# Patient Record
Sex: Male | Born: 1975 | Race: Black or African American | Hispanic: No | Marital: Married | Smoking: Current some day smoker
Health system: Southern US, Community
[De-identification: ages and names within clinical notes are randomized; demographics above are authoritative.]

## PROBLEM LIST (undated history)

## (undated) DIAGNOSIS — I10 Essential (primary) hypertension: Secondary | ICD-10-CM

## (undated) DIAGNOSIS — E669 Obesity, unspecified: Secondary | ICD-10-CM

## (undated) DIAGNOSIS — G4730 Sleep apnea, unspecified: Secondary | ICD-10-CM

## (undated) HISTORY — PX: HERNIA REPAIR: SHX51

---

## 2011-05-12 ENCOUNTER — Emergency Department (HOSPITAL_COMMUNITY): Payer: Medicaid Other

## 2011-05-12 ENCOUNTER — Encounter (HOSPITAL_COMMUNITY): Payer: Self-pay | Admitting: Emergency Medicine

## 2011-05-12 ENCOUNTER — Emergency Department (HOSPITAL_COMMUNITY)
Admission: EM | Admit: 2011-05-12 | Discharge: 2011-05-12 | Disposition: A | Discharge status: Home / Self Care | Payer: Medicaid Other | Attending: Emergency Medicine | Admitting: Emergency Medicine

## 2011-05-12 ENCOUNTER — Other Ambulatory Visit: Payer: Self-pay

## 2011-05-12 DIAGNOSIS — R0602 Shortness of breath: Secondary | ICD-10-CM | POA: Insufficient documentation

## 2011-05-12 DIAGNOSIS — F17200 Nicotine dependence, unspecified, uncomplicated: Secondary | ICD-10-CM | POA: Insufficient documentation

## 2011-05-12 DIAGNOSIS — R51 Headache: Secondary | ICD-10-CM

## 2011-05-12 DIAGNOSIS — Z79899 Other long term (current) drug therapy: Secondary | ICD-10-CM | POA: Insufficient documentation

## 2011-05-12 DIAGNOSIS — R079 Chest pain, unspecified: Secondary | ICD-10-CM | POA: Insufficient documentation

## 2011-05-12 DIAGNOSIS — I10 Essential (primary) hypertension: Secondary | ICD-10-CM

## 2011-05-12 DIAGNOSIS — H538 Other visual disturbances: Secondary | ICD-10-CM | POA: Insufficient documentation

## 2011-05-12 DIAGNOSIS — E669 Obesity, unspecified: Secondary | ICD-10-CM

## 2011-05-12 DIAGNOSIS — Z720 Tobacco use: Secondary | ICD-10-CM

## 2011-05-12 HISTORY — DX: Essential (primary) hypertension: I10

## 2011-05-12 LAB — BASIC METABOLIC PANEL
BUN: 13 mg/dL (ref 6–23)
Chloride: 103 mEq/L (ref 96–112)
Creatinine, Ser: 1.16 mg/dL (ref 0.50–1.35)
GFR calc Af Amer: >90 mL/min (ref >90)
GFR calc non Af Amer: 79 mL/min — ABNORMAL LOW (ref >90)

## 2011-05-12 LAB — CBC
HCT: 39 % (ref 39.0–52.0)
MCHC: 35.6 g/dL (ref 30.0–36.0)
MCV: 88.8 fL (ref 78.0–100.0)
RDW: 13 % (ref 11.5–15.5)

## 2011-05-12 LAB — GLUCOSE, CAPILLARY

## 2011-05-12 MED ORDER — ONDANSETRON HCL 4 MG/2ML IJ SOLN
4.0000 mg | Freq: Once | INTRAMUSCULAR | Status: AC
  Administered 2011-05-12: 4 mg via INTRAVENOUS
  Filled 2011-05-12: qty 2

## 2011-05-12 MED ORDER — LISINOPRIL-HYDROCHLOROTHIAZIDE 20-12.5 MG PO TABS: 1.0000 | ORAL_TABLET | Freq: Every day | ORAL | Status: DC

## 2011-05-12 MED ORDER — SODIUM CHLORIDE 0.9 % IV BOLUS (SEPSIS)
1000.0000 mL | Freq: Once | INTRAVENOUS | Status: AC
  Administered 2011-05-12: 1000 mL via INTRAVENOUS

## 2011-05-12 MED ORDER — HYDROMORPHONE HCL PF 1 MG/ML IJ SOLN
1.0000 mg | Freq: Once | INTRAMUSCULAR | Status: AC
  Administered 2011-05-12: 1 mg via INTRAVENOUS
  Filled 2011-05-12: qty 1

## 2011-05-12 NOTE — ED Provider Notes (Signed)
History     CSN: [id number]  Arrival date & time 05/12/11  1801   First MD Initiated Contact with Patient 05/12/11 2035      Chief Complaint  Patient presents with  . Chest Pain  . Headache    (Consider location/radiation/quality/duration/timing/severity/associated sxs/prior treatment) HPI  Patient states he has a history of hypertension and tobacco abuse but he has not taken his lisinopril-HCTZ in over a week because he has run out of his medication and recently moved to [facility] from [facility] presents to the ER complaining of a one-week history of waxing and waning left-sided chest pain stating that he will get acute onset chest pain that will last seconds to sometimes minutes. Patient states occasionally that chest pain is associated with mild shortness of breath and headache. Patient states that over the last week he's also been having waxing and waning headaches that are sometimes associated with chest pain and sometimes occur on their own. Patient states headache will come on acutely and last from minutes to hours and then resolved. Patient states he has a one-week history of intermittent blurry vision. Patient states he's currently having a headache and mild blurred vision but denies any chest pain. Patient states he last had chest pain at approximately 10 AM this morning. Patient denies family history of early heart disease or heart attack. Patient denies aggravating or alleviating factors. Patient is taking no medication prior to arrival to leave the symptoms. Patient notes that his blood pressure has been running higher than usual over the last week. He denies fevers, chills, dizziness, neck stiffness, abdominal pain, n/v/d, diaphoresis, dysuria, hematuria or blood in stool.  Past Medical History  Diagnosis Date  . Hypertension     History reviewed. No pertinent past surgical history.  History reviewed. No pertinent family history.  History  Substance Use Topics    . Smoking status: Current Everyday Smoker  . Smokeless tobacco: Not on file  . Alcohol Use: No      Review of Systems  All other systems reviewed and are negative.    Allergies  Review of patient's allergies indicates no known allergies.  Home Medications   Current Outpatient Rx  Name Route Sig Dispense Refill  . LISINOPRIL-HYDROCHLOROTHIAZIDE 10-12.5 MG PO TABS Oral Take 1 tablet by mouth daily.    . ADULT MULTIVITAMIN W/MINERALS CH Oral Take 2 tablets by mouth daily.      BP 143/79  Pulse 84  Temp(Src) 98 F (36.7 C) (Oral)  Resp 17  Ht 5\' 10"  (1.778 m)  Wt 280 lb (127.007 kg)  BMI 40.18 kg/m2  SpO2 95%  Physical Exam  Nursing note and vitals reviewed. Constitutional: He is oriented to person, place, and time. He appears well-developed and well-nourished. No distress.       obese  HENT:  Head: Normocephalic and atraumatic.  Eyes: Conjunctivae and EOM are normal. Pupils are equal, round, and reactive to light.  Neck: Normal range of motion. Neck supple.  Cardiovascular: Normal rate, regular rhythm, normal heart sounds and intact distal pulses.  Exam reveals no gallop and no friction rub.   No murmur heard. Pulmonary/Chest: Effort normal and breath sounds normal. No respiratory distress. He has no wheezes. He has no rales. He exhibits no tenderness.  Abdominal: Soft. Bowel sounds are normal. He exhibits no distension and no mass. There is no tenderness. There is no rebound and no guarding.  Musculoskeletal: Normal range of motion. He exhibits no edema and no tenderness.  Neurological: He is alert and oriented to person, place, and time. No cranial nerve deficit. Coordination normal.  Skin: Skin is warm and dry. No rash noted. He is not diaphoretic. No erythema.  Psychiatric: He has a normal mood and affect.    ED Course  Procedures (including critical care time)  IV fluids, IV dilaudid, zofran  Resolution of headache  Patient is sitting up eating in room  without complaint.    Date: 05/12/2011  Rate: 98  Rhythm: normal sinus rhythm  QRS Axis: normal  Intervals: normal  ST/T Wave abnormalities: nonspecific T wave changes  Conduction Disutrbances:none  Narrative Interpretation:   Old EKG Reviewed: none available     Labs Reviewed  BASIC METABOLIC PANEL - Abnormal; Notable for the following:    GFR calc non Af Amer 79 (*)    All other components within normal limits  GLUCOSE, CAPILLARY - Abnormal; Notable for the following:    Glucose-Capillary 100 (*)    All other components within normal limits  CBC  POCT I-STAT TROPONIN I   Dg Chest 2 View  05/12/2011  *RADIOLOGY REPORT*  Clinical Data: Chest pain, headache  CHEST - 2 VIEW  Comparison: None.  Findings: Cardiomediastinal silhouette is unremarkable.  Mild elevation of the left hemidiaphragm.  No acute infiltrate or pleural effusion.  No pulmonary edema.  IMPRESSION: No active disease.  Mild elevation of the left hemidiaphragm.  Original Report Authenticated By: Natasha Mead, M.D.     1. Headache   2. Obesity   3. Tobacco abuse   4. Chest pain   5. Hypertension       MDM  Low risk ACS with a week of wax and wane CP without acute findings on labs, CXR or EKG. Last CP at 10am today. Low risk PE and PERC negative. Abdomen soft and nontender.     Medical screening examination/treatment/procedure(s) were performed by non-physician practitioner and as supervising physician I was immediately available for consultation/collaboration. Osvaldo Human, M.D.     [place], [place] 05/12/11 2231  Carleene Cooper III, MD 05/13/11 [id number]

## 2011-05-12 NOTE — ED Notes (Signed)
Pt c/o left sided CP and HA with some blurred vision intermittently x 1 week; pt sts some SOB

## 2011-05-29 ENCOUNTER — Encounter (HOSPITAL_COMMUNITY): Payer: Self-pay | Admitting: *Deleted

## 2011-05-29 ENCOUNTER — Emergency Department (HOSPITAL_COMMUNITY)
Admission: EM | Admit: 2011-05-29 | Discharge: 2011-05-29 | Disposition: A | Discharge status: Home / Self Care | Payer: Medicaid Other | Attending: Emergency Medicine | Admitting: Emergency Medicine

## 2011-05-29 DIAGNOSIS — Z79899 Other long term (current) drug therapy: Secondary | ICD-10-CM | POA: Insufficient documentation

## 2011-05-29 DIAGNOSIS — H6690 Otitis media, unspecified, unspecified ear: Secondary | ICD-10-CM

## 2011-05-29 DIAGNOSIS — J029 Acute pharyngitis, unspecified: Secondary | ICD-10-CM

## 2011-05-29 DIAGNOSIS — F17200 Nicotine dependence, unspecified, uncomplicated: Secondary | ICD-10-CM | POA: Insufficient documentation

## 2011-05-29 DIAGNOSIS — R599 Enlarged lymph nodes, unspecified: Secondary | ICD-10-CM | POA: Insufficient documentation

## 2011-05-29 DIAGNOSIS — I10 Essential (primary) hypertension: Secondary | ICD-10-CM | POA: Insufficient documentation

## 2011-05-29 DIAGNOSIS — R220 Localized swelling, mass and lump, head: Secondary | ICD-10-CM | POA: Insufficient documentation

## 2011-05-29 MED ORDER — ACETAMINOPHEN-CODEINE #2 300-15 MG PO TABS: 1.0000 | ORAL_TABLET | ORAL | Status: AC | PRN

## 2011-05-29 MED ORDER — ANTIPYRINE-BENZOCAINE 5.4-1.4 % OT SOLN: 3.0000 [drp] | OTIC | Status: AC | PRN

## 2011-05-29 MED ORDER — AMOXICILLIN 500 MG PO CAPS: 500.0000 mg | ORAL_CAPSULE | Freq: Three times a day (TID) | ORAL | Status: AC

## 2011-05-29 NOTE — Discharge Instructions (Signed)
Antibiotic Nonuse  Your caregiver felt that the infection or problem was not one that would be helped with an antibiotic. Infections may be caused by viruses or bacteria. Only a caregiver can tell which one of these is the likely cause of an illness. A cold is the most common cause of infection in both adults and children. A cold is a virus. Antibiotic treatment will have no effect on a viral infection. Viruses can lead to many lost days of work caring for sick children and many missed days of school. Children may catch as many as 10 "colds" or "flus" per year during which they can be tearful, cranky, and uncomfortable. The goal of treating a virus is aimed at keeping the ill person comfortable. Antibiotics are medications used to help the body fight bacterial infections. There are relatively few types of bacteria that cause infections but there are hundreds of viruses. While both viruses and bacteria cause infection they are very different types of germs. A viral infection will typically go away by itself within 7 to 10 days. Bacterial infections may spread or get worse without antibiotic treatment. Examples of bacterial infections are:  Sore throats (like strep throat or tonsillitis).   Infection in the lung (pneumonia).   Ear and skin infections.  Examples of viral infections are:  Colds or flus.   Most coughs and bronchitis.   Sore throats not caused by Strep.   Runny noses.  It is often best not to take an antibiotic when a viral infection is the cause of the problem. Antibiotics can kill off the helpful bacteria that we have inside our body and allow harmful bacteria to start growing. Antibiotics can cause side effects such as allergies, nausea, and diarrhea without helping to improve the symptoms of the viral infection. Additionally, repeated uses of antibiotics can cause bacteria inside of our body to become resistant. That resistance can be passed onto harmful bacterial. The next time  you have an infection it may be harder to treat if antibiotics are used when they are not needed. Not treating with antibiotics allows our own immune system to develop and take care of infections more efficiently. Also, antibiotics will work better for [place] when they are prescribed for bacterial infections. Treatments for a child that is ill may include:  Give extra fluids throughout the day to stay hydrated.   Get plenty of rest.   Only give your child over-the-counter or prescription medicines for pain, discomfort, or fever as directed by your caregiver.   The use of a cool mist humidifier may help stuffy noses.   Cold medications if suggested by your caregiver.  Your caregiver may decide to start you on an antibiotic if:  The problem you were seen for today continues for a longer length of time than expected.   You develop a secondary bacterial infection.  SEEK MEDICAL CARE IF:  Fever lasts longer than 5 days.   Symptoms continue to get worse after 5 to 7 days or become severe.   Difficulty in breathing develops.   Signs of dehydration develop (poor drinking, rare urinating, dark colored urine).   Changes in behavior or worsening tiredness (listlessness or lethargy).  Document Released: 05/29/2001 Document Revised: 11/30/2010 Document Reviewed: 11/25/2008 [facility] Patient Information 2012 [place], [place].Otitis Media, Adult A middle ear infection is an infection in the space behind the eardrum. It often happens along with a cold. It is caused by a germ that starts growing in that space. Your neck may  feel puffy (swollen) on the side of the ear infection. HOME CARE  Take your medicine as told. Finish it even if you start to feel better.   Nose medicine (nasal decongestant) may help the tube that connects the ear and throat (eustachian tube) drain better. It may also help with discomfort.   Follow up with your doctor in 10 to 14 days or as told by your doctor. This is to make sure  the infection is gone.  GET HELP RIGHT AWAY IF:   You do not start to feel better in 2 to 3 days.   You have pain that is not helped with medicine.   You cannot use the medicine as told.   You feel worse instead of better.   You develop puffiness, redness, or pain around the ear.   You get a stiff neck.  MAKE SURE YOU:   Understand these instructions.   Will watch your condition.   Will get help right away if you are not doing well or get worse.  Document Released: 09/06/2007 Document Revised: 11/30/2010 Document Reviewed: 09/06/2007 [facility] Patient Information 2012 [place], [place].Sore Throat Sore throats may be caused by bacteria and viruses. They may also be caused by:  Smoking.   Pollution.   Allergies.  If a sore throat is due to strep infection (a bacterial infection), you may need:  A throat swab.   A culture test to verify the strep infection.  You will need one of these:  An antibiotic shot.   Oral medicine for a full 10 days.  Strep infection is very contagious. A doctor should check any close contacts who have a sore throat or fever. A sore throat caused by a virus infection will usually last only 3-4 days. Antibiotics will not treat a viral sore throat.  Infectious mononucleosis (a viral disease), however, can cause a sore throat that lasts for up to 3 weeks. Mononucleosis can be diagnosed with blood tests. You must have been sick for at least 1 week in order for the test to give accurate results. HOME CARE INSTRUCTIONS   To treat a sore throat, take mild pain medicine.   Increase your fluids.   Eat a soft diet.   Do not smoke.   Gargling with warm water or salt water (1 tsp. salt in 8 oz. water) can be helpful.   Try throat sprays or lozenges or sucking on hard candy to ease the symptoms.  Call your doctor if your sore throat lasts longer than 1 week.  SEEK IMMEDIATE MEDICAL CARE IF:  You have difficulty breathing.   You have increased  swelling in the throat.   You have pain so severe that you are unable to swallow fluids or your saliva.   You have a severe headache, a high fever, vomiting, or a red rash.  Document Released: 04/27/2004 Document Revised: 11/30/2010 Document Reviewed: 03/07/2007 [facility] Patient Information 2012 [place], [place].

## 2011-05-29 NOTE — ED Provider Notes (Addendum)
History     CSN: [id number]  Arrival date & time 05/29/11  1747   First MD Initiated Contact with Patient 05/29/11 1853      Chief Complaint  Patient presents with  . Sore Throat    (Consider location/radiation/quality/duration/timing/severity/associated sxs/prior treatment) HPI  Pt presents to ED for evaluation of right ear and throat pain for 3 days. The pain is at a 8/10 and throbbing, it does not radiate anywhere. Pt denies N/V/D, abd pain, wheezing, cough, SOB, fevers, chills, weakness or any other symptoms.  Past Medical History  Diagnosis Date  . Hypertension     History reviewed. No pertinent past surgical history.  History reviewed. No pertinent family history.  History  Substance Use Topics  . Smoking status: Current Everyday Smoker  . Smokeless tobacco: Not on file  . Alcohol Use: No      Review of Systems  Allergies  Review of patient's allergies indicates no known allergies.  Home Medications   Current Outpatient Rx  Name Route Sig Dispense Refill  . IBUPROFEN 200 MG PO TABS Oral Take 400 mg by mouth every 6 (six) hours as needed. For pain    . LISINOPRIL-HYDROCHLOROTHIAZIDE 20-12.5 MG PO TABS Oral Take 1 tablet by mouth daily. 30 tablet 0  . ADULT MULTIVITAMIN W/MINERALS CH Oral Take 2 tablets by mouth daily.    . ACETAMINOPHEN-CODEINE #2 300-15 MG PO TABS Oral Take 1 tablet by mouth every 4 (four) hours as needed for pain. 30 tablet 0  . AMOXICILLIN 500 MG PO CAPS Oral Take 1 capsule (500 mg total) by mouth 3 (three) times daily. 21 capsule 0  . ANTIPYRINE-BENZOCAINE 5.4-1.4 % OT SOLN Right Ear Place 3 drops into the right ear every 2 (two) hours as needed for pain. 10 mL 0    BP 127/94  Pulse 100  Temp(Src) 98.1 F (36.7 C) (Oral)  Resp 18  SpO2 97%  Physical Exam  Nursing note and vitals reviewed. Constitutional: He is oriented to person, place, and time. He appears well-developed and well-nourished. No distress.  HENT:  Head:  Normocephalic and atraumatic. No trismus in the jaw.  Right Ear: Ear canal normal. There is swelling. Tympanic membrane is injected.  Left Ear: Tympanic membrane, external ear and ear canal normal.  Nose: Nose normal. No rhinorrhea. Right sinus exhibits no maxillary sinus tenderness and no frontal sinus tenderness. Left sinus exhibits no maxillary sinus tenderness and no frontal sinus tenderness.  Mouth/Throat: Uvula is midline and mucous membranes are normal. Normal dentition. No dental abscesses or uvula swelling. Oropharyngeal exudate and posterior oropharyngeal edema present. No posterior oropharyngeal erythema or tonsillar abscesses.       No submental edema, tongue not elevated, no trismus. No impending airway obstruction; Pt able to speak full sentences, swallow intact, no drooling, stridor, or tonsillar/uvula displacement.  Eyes: Conjunctivae are normal.  Neck: Trachea normal, normal range of motion and full passive range of motion without pain. Neck supple. No rigidity. Erythema present. Normal range of motion present. No Brudzinski's sign noted.  Cardiovascular: Normal rate and regular rhythm.   Pulmonary/Chest: Effort normal and breath sounds normal. No stridor. No respiratory distress. He has no wheezes.  Musculoskeletal: Normal range of motion.  Lymphadenopathy:       Head (right side): No preauricular and no posterior auricular adenopathy present.       Head (left side): No preauricular and no posterior auricular adenopathy present.    He has cervical adenopathy.  Neurological: He is alert  and oriented to person, place, and time.  Skin: Skin is warm and dry. No rash noted. He is not diaphoretic.  Psychiatric: He has a normal mood and affect.    ED Course  Procedures (including critical care time)  Labs Reviewed - No data to display No results found.   1. Pharyngitis   2. Otitis media       MDM  Pt given Tylenol with codeine, Auralgan and amoxicillin. Pt can return to  ED or go to Urgent care if symptoms worsenor persist.        Dorthula Matas, PA 05/29/11 1919  Dorthula Matas, PA 07/06/11 0800

## 2011-05-29 NOTE — ED Notes (Signed)
To ed for eval of right side ear pain and throat pain for the past 3 days.

## 2011-06-01 NOTE — ED Provider Notes (Signed)
Medical screening examination/treatment/procedure(s) were performed by non-physician practitioner and as supervising physician I was immediately available for consultation/collaboration.  Raeford Razor, MD 06/01/11 [phone]

## 2011-07-06 NOTE — ED Provider Notes (Signed)
Medical screening examination/treatment/procedure(s) were performed by non-physician practitioner and as supervising physician I was immediately available for consultation/collaboration.  Raeford Razor, MD 07/06/11 1859

## 2011-07-24 ENCOUNTER — Emergency Department (HOSPITAL_COMMUNITY)
Admission: EM | Admit: 2011-07-24 | Discharge: 2011-07-25 | Disposition: A | Discharge status: Home / Self Care | Payer: Medicaid Other | Attending: Emergency Medicine | Admitting: Emergency Medicine

## 2011-07-24 ENCOUNTER — Emergency Department (HOSPITAL_COMMUNITY): Payer: Medicaid Other

## 2011-07-24 ENCOUNTER — Encounter (HOSPITAL_COMMUNITY): Payer: Self-pay | Admitting: *Deleted

## 2011-07-24 DIAGNOSIS — N498 Inflammatory disorders of other specified male genital organs: Secondary | ICD-10-CM | POA: Insufficient documentation

## 2011-07-24 DIAGNOSIS — F17200 Nicotine dependence, unspecified, uncomplicated: Secondary | ICD-10-CM | POA: Insufficient documentation

## 2011-07-24 DIAGNOSIS — M7989 Other specified soft tissue disorders: Secondary | ICD-10-CM | POA: Insufficient documentation

## 2011-07-24 DIAGNOSIS — I10 Essential (primary) hypertension: Secondary | ICD-10-CM | POA: Insufficient documentation

## 2011-07-24 DIAGNOSIS — N509 Disorder of male genital organs, unspecified: Secondary | ICD-10-CM | POA: Insufficient documentation

## 2011-07-24 DIAGNOSIS — N451 Epididymitis: Secondary | ICD-10-CM

## 2011-07-24 DIAGNOSIS — N453 Epididymo-orchitis: Secondary | ICD-10-CM | POA: Insufficient documentation

## 2011-07-24 DIAGNOSIS — L0291 Cutaneous abscess, unspecified: Secondary | ICD-10-CM

## 2011-07-24 NOTE — ED Provider Notes (Signed)
History     CSN: [id number]  Arrival date & time 07/24/11  2059   First MD Initiated Contact with Patient 07/24/11 2303      Chief Complaint  Patient presents with  . Testicle Pain    pump on testicle  . Leg Swelling    LEG feels tight and hard bump on back of leg    (Consider location/radiation/quality/duration/timing/severity/associated sxs/prior treatment) HPI Comments: Patient noticed several days ago.  He has a small ingrown hair on his scrotal sac, which he squeezed and got out a small amount of purulent material.  It scabbed over.  Yesterday.  He removed the scab and now has an increase in swelling and tenderness to the area.  He also is noted that he has a mass in the left scrotal sac that is separate from his testicle, which is very concerning to him.  He says it intermittently gets larger.  He also has noted some inflamed lymph nodes in the groin area.  He has a history of recurrent left inguinal hernia with repair x3  Patient is a 36 y.o. male presenting with testicular pain. The history is provided by the patient.  Testicle Pain This is a recurrent problem. The current episode started yesterday. The problem occurs constantly. The problem has been unchanged. Pertinent negatives include no abdominal pain, anorexia, arthralgias, change in bowel habit, fever, nausea, numbness or urinary symptoms.    Past Medical History  Diagnosis Date  . Hypertension     History reviewed. No pertinent past surgical history.  No family history on file.  History  Substance Use Topics  . Smoking status: Current Everyday Smoker  . Smokeless tobacco: Not on file  . Alcohol Use: No      Review of Systems  Constitutional: Negative for fever.  Cardiovascular: Negative for leg swelling.  Gastrointestinal: Negative for nausea, abdominal pain, anorexia and change in bowel habit.  Genitourinary: Positive for scrotal swelling and testicular pain. Negative for dysuria, urgency, frequency and  decreased urine volume.  Musculoskeletal: Negative for arthralgias.  Neurological: Negative for numbness.    Allergies  Review of patient's allergies indicates no known allergies.  Home Medications   Current Outpatient Rx  Name Route Sig Dispense Refill  . LISINOPRIL-HYDROCHLOROTHIAZIDE 20-12.5 MG PO TABS Oral Take 1 tablet by mouth daily.    . ADULT MULTIVITAMIN W/MINERALS CH Oral Take 2 tablets by mouth daily.    . SULFAMETHOXAZOLE-TMP DS 800-160 MG PO TABS Oral Take 1 tablet by mouth 2 (two) times daily. 14 tablet 0    BP 135/96  Pulse 91  Temp(Src) 98.7 F (37.1 C) (Oral)  Resp 22  SpO2 96%  Physical Exam  Constitutional: He appears well-developed and well-nourished.  HENT:  Head: Normocephalic.  Eyes: Pupils are equal, round, and reactive to light.  Cardiovascular: Normal rate.   Pulmonary/Chest: He is in respiratory distress.  Abdominal: Hernia confirmed negative in the left inguinal area.  Genitourinary: Left testis shows mass and tenderness. Left testis shows no swelling. Left testis is descended. Circumcised. No penile tenderness.       On the posterior aspect of the left side of the scrotal sac.  There is a small superficial abscess area with a central draining site.  No surrounding erythema. He does have a soft.  Minimally tender mass within the left hemiscrotum  Musculoskeletal: Normal range of motion.  Lymphadenopathy:       Left: Inguinal adenopathy present.  Neurological: He is alert.  Skin: Skin is warm.  ED Course  Procedures (including critical care time)  Labs Reviewed - No data to display US Scrotum  07/25/2011  *RADIOLOGY REPORT*  Clinical Data:  History of left testicular mass since 2008.  Prior inguinal hernia repairs.  Normal white cell count.  No trauma or fever.  SCROTAL ULTRASOUND DOPPLER ULTRASOUND OF THE TESTICLES  Technique: Complete ultrasound examination of the testicles, epididymis, and other scrotal structures was performed.  Color  and spectral Doppler ultrasound were also utilized to evaluate blood flow to the testicles.  Comparison:  None  Findings:  Right testis:  Measures 4.1 x 2.5 x 3.4 cm.  Homogeneous normal parenchymal echotexture without focal mass lesion.  There are scattered echogenic foci throughout the right testis suggesting microcalcifications.  Left testis:  Measures 4.5 x 2.8 x 3.5 cm.  Homogeneous normal parenchymal echotexture without focal mass lesion.  There are scattered echogenic foci throughout the left testis suggesting microcalcifications.  Right epididymis:  Normal in size and appearance.  Left epididymis:  There is heterogeneous prominence of the left epididymis corresponding to the area of palpable mass.  No abnormal fluid collection.  Normal color flow.  Hydocele:  Small bilateral hydroceles.  Varicocele:  No evidence of varicocele.  Pulsed Doppler interrogation of both testes demonstrates normal arterial and venous wave forms bilaterally. Normal homogeneous color flow demonstrated to both testes and epididymides. Diffuse bilateral scrotal skin thickening.  IMPRESSION: The palpable area in the left hemiscrotum appears to represent thickened epididymis with otherwise normal appearance.  No discrete mass or varicosity demonstrated.  Small bilateral hydroceles.  Bilateral testicular microcalcifications.  No intratesticular masses.  Bilateral scrotal skin thickening.  Original Report Authenticated By: Marlon Pel, M.D.   [place] Art/ven Flow Abd Pelv Doppler  07/25/2011  *RADIOLOGY REPORT*  Clinical Data:  History of left testicular mass since 2008.  Prior inguinal hernia repairs.  Normal white cell count.  No trauma or fever.  SCROTAL ULTRASOUND DOPPLER ULTRASOUND OF THE TESTICLES  Technique: Complete ultrasound examination of the testicles, epididymis, and other scrotal structures was performed.  Color and spectral Doppler ultrasound were also utilized to evaluate blood flow to the testicles.  Comparison:  None   Findings:  Right testis:  Measures 4.1 x 2.5 x 3.4 cm.  Homogeneous normal parenchymal echotexture without focal mass lesion.  There are scattered echogenic foci throughout the right testis suggesting microcalcifications.  Left testis:  Measures 4.5 x 2.8 x 3.5 cm.  Homogeneous normal parenchymal echotexture without focal mass lesion.  There are scattered echogenic foci throughout the left testis suggesting microcalcifications.  Right epididymis:  Normal in size and appearance.  Left epididymis:  There is heterogeneous prominence of the left epididymis corresponding to the area of palpable mass.  No abnormal fluid collection.  Normal color flow.  Hydocele:  Small bilateral hydroceles.  Varicocele:  No evidence of varicocele.  Pulsed Doppler interrogation of both testes demonstrates normal arterial and venous wave forms bilaterally. Normal homogeneous color flow demonstrated to both testes and epididymides. Diffuse bilateral scrotal skin thickening.  IMPRESSION: The palpable area in the left hemiscrotum appears to represent thickened epididymis with otherwise normal appearance.  No discrete mass or varicosity demonstrated.  Small bilateral hydroceles.  Bilateral testicular microcalcifications.  No intratesticular masses.  Bilateral scrotal skin thickening.  Original Report Authenticated By: Marlon Pel, M.D.     1. Epididymitis without abscess   2. Skin abscess       MDM  Patient has a small healing superficial abscess of the  scrotal wall of more concern is a minimally tender mass within the left hemiscrotum.  Will obtain ultrasound        Arman Filter, NP 07/25/11 4782

## 2011-07-24 NOTE — ED Notes (Signed)
PT reports he had a bump on testicle Lt side he expressed pus and now is worried about infection. He also reports a hard bump on the back of RT leg possible blood clot.

## 2011-07-25 MED ORDER — SULFAMETHOXAZOLE-TMP DS 800-160 MG PO TABS: 1.0000 | ORAL_TABLET | Freq: Two times a day (BID) | ORAL | Status: AC

## 2011-07-25 MED ORDER — SULFAMETHOXAZOLE-TMP DS 800-160 MG PO TABS
1.0000 | ORAL_TABLET | Freq: Once | ORAL | Status: AC
  Administered 2011-07-25: 1 via ORAL
  Filled 2011-07-25: qty 1

## 2011-07-25 NOTE — Discharge Instructions (Signed)
Abscess An abscess (boil or furuncle) is an infected area under your skin. This area is filled with yellowish white fluid (pus). HOME CARE   Only take medicine as told by your doctor.   Keep the skin clean around your abscess. Keep clothes that may touch the abscess clean.   Change any bandages (dressings) as told by your doctor.   Avoid direct skin contact with other people. The infection can spread by skin contact with others.   Practice good hygiene and do not share personal care items.   Do not share athletic equipment, towels, or whirlpools. Shower after every practice or work out session.   If a draining area cannot be covered:   Do not play sports.   Children should not go to daycare until the wound has healed or until fluid (drainage) stops coming out of the wound.   See your doctor for a follow-up visit as told.  GET HELP RIGHT AWAY IF:   There is more pain, puffiness (swelling), and redness in the wound site.   There is fluid or bleeding from the wound site.   You have muscle aches, chills, fever, or feel sick.   You or your child has a temperature by mouth above 102 F (38.9 C), not controlled by medicine.   Your baby is older than 3 months with a rectal temperature of 102 F (38.9 C) or higher.  MAKE SURE YOU:   Understand these instructions.   Will watch your condition.   Will get help right away if you are not doing well or get worse.  Document Released: 09/06/2007 Document Revised: 03/09/2011 Document Reviewed: 09/06/2007 [employer] Patient Information 2012 [employer]. 

## 2011-07-25 NOTE — ED Provider Notes (Signed)
Medical screening examination/treatment/procedure(s) were performed by non-physician practitioner and as supervising physician I was immediately available for consultation/collaboration.   Ilan Kahrs D Belmont Valli, MD 07/25/11 0657 

## 2011-08-06 ENCOUNTER — Emergency Department (HOSPITAL_COMMUNITY)
Admission: EM | Admit: 2011-08-06 | Discharge: 2011-08-06 | Disposition: A | Discharge status: Home / Self Care | Payer: Medicaid Other | Attending: Emergency Medicine | Admitting: Emergency Medicine

## 2011-08-06 ENCOUNTER — Encounter (HOSPITAL_COMMUNITY): Payer: Self-pay | Admitting: Emergency Medicine

## 2011-08-06 ENCOUNTER — Emergency Department (HOSPITAL_COMMUNITY): Payer: Medicaid Other

## 2011-08-06 DIAGNOSIS — M25519 Pain in unspecified shoulder: Secondary | ICD-10-CM | POA: Insufficient documentation

## 2011-08-06 DIAGNOSIS — I10 Essential (primary) hypertension: Secondary | ICD-10-CM | POA: Insufficient documentation

## 2011-08-06 DIAGNOSIS — R0602 Shortness of breath: Secondary | ICD-10-CM | POA: Insufficient documentation

## 2011-08-06 DIAGNOSIS — R079 Chest pain, unspecified: Secondary | ICD-10-CM | POA: Insufficient documentation

## 2011-08-06 LAB — DIFFERENTIAL
Eosinophils Absolute: 0.1 10*3/uL (ref 0.0–0.7)
Lymphs Abs: 2.5 10*3/uL (ref 0.7–4.0)
Monocytes Relative: 7 % (ref 3–12)
Neutrophils Relative %: 65 % (ref 43–77)

## 2011-08-06 LAB — BASIC METABOLIC PANEL
BUN: 15 mg/dL (ref 6–23)
CO2: 24 mEq/L (ref 19–32)
Chloride: 102 mEq/L (ref 96–112)
Glucose, Bld: 117 mg/dL — ABNORMAL HIGH (ref 70–99)
Potassium: 3.5 mEq/L (ref 3.5–5.1)

## 2011-08-06 LAB — CBC
Hemoglobin: 13.6 g/dL (ref 13.0–17.0)
MCH: 31 pg (ref 26.0–34.0)
RBC: 4.39 MIL/uL (ref 4.22–5.81)

## 2011-08-06 LAB — POCT I-STAT, CHEM 8
BUN: 15 mg/dL (ref 6–23)
Chloride: 107 mEq/L (ref 96–112)
Potassium: 3.7 mEq/L (ref 3.5–5.1)
Sodium: 141 mEq/L (ref 135–145)
TCO2: 24 mmol/L (ref 0–100)

## 2011-08-06 LAB — POCT I-STAT TROPONIN I
Troponin i, poc: 0 ng/mL (ref 0.00–0.08)
Troponin i, poc: 0 ng/mL (ref 0.00–0.08)

## 2011-08-06 MED ORDER — KETOROLAC TROMETHAMINE 30 MG/ML IJ SOLN
30.0000 mg | Freq: Once | INTRAMUSCULAR | Status: AC
  Administered 2011-08-06: 30 mg via INTRAMUSCULAR
  Filled 2011-08-06: qty 1

## 2011-08-06 NOTE — ED Notes (Signed)
Patient presents with c/o chest pain.  States the pain radiated across his chest.  Some SOB  Denies having this in the past.

## 2011-08-06 NOTE — ED Provider Notes (Signed)
Medical screening examination/treatment/procedure(s) were performed by non-physician practitioner and as supervising physician I was immediately available for consultation/collaboration.  Olivia Mackie, MD 08/06/11 [phone]

## 2011-08-06 NOTE — ED Provider Notes (Signed)
History     CSN: [id number]  Arrival date & time 08/06/11  0030   First MD Initiated Contact with Patient 08/06/11 0105      Chief Complaint  Patient presents with  . Chest Pain    (Consider location/radiation/quality/duration/timing/severity/associated sxs/prior treatment) HPI Comments: Patient was standing on the side of a pond fishing when he developed left chest pain.  That does not radiate.  Slight feeling of shortness of breath.  He was momentarily lightheaded and felt like he had to sit down.  He then felt heaviness in his thighs.  He thought this would pass when him lay down for short period of time, but is still having the discomfort is no longer having lightheadedness or by having this  Patient is a 36 y.o. male presenting with chest pain. The history is provided by the patient.  Chest Pain The chest pain began 3 - 5 hours ago. Chest pain occurs constantly. The chest pain is unchanged. At its most intense, the pain is at 5/10. The pain is currently at 5/10. The severity of the pain is moderate. The quality of the pain is described as heavy and pressure-like. The pain does not radiate. Primary symptoms include shortness of breath. Pertinent negatives for primary symptoms include no fever, no syncope, no cough, no palpitations, no nausea, no vomiting and no dizziness.  Pertinent negatives for associated symptoms include no weakness.     Past Medical History  Diagnosis Date  . Hypertension     History reviewed. No pertinent past surgical history.  History reviewed. No pertinent family history.  History  Substance Use Topics  . Smoking status: Current Everyday Smoker  . Smokeless tobacco: Not on file  . Alcohol Use: No      Review of Systems  Constitutional: Negative for fever and appetite change.  HENT: Negative for congestion and rhinorrhea.   Eyes: Negative for visual disturbance.  Respiratory: Positive for shortness of breath. Negative for cough.     Cardiovascular: Positive for chest pain. Negative for palpitations, leg swelling and syncope.  Gastrointestinal: Negative for nausea and vomiting.  Musculoskeletal: Negative for myalgias, back pain and joint swelling.  Skin: Negative for pallor.  Neurological: Negative for dizziness, syncope, weakness and headaches.    Allergies  Review of patient's allergies indicates no known allergies.  Home Medications   Current Outpatient Rx  Name Route Sig Dispense Refill  . AMLODIPINE BESYLATE 10 MG PO TABS Oral Take 10 mg by mouth daily.    . FUROSEMIDE 20 MG PO TABS Oral Take 20 mg by mouth 2 (two) times daily.      BP 114/72  Pulse 74  Temp(Src) 98.4 F (36.9 C) (Oral)  Resp 15  SpO2 98%  Physical Exam  Constitutional: He is oriented to person, place, and time. He appears well-developed and well-nourished.  HENT:  Head: Normocephalic.  Eyes: Pupils are equal, round, and reactive to light.  Neck: Normal range of motion.  Cardiovascular: Normal rate and regular rhythm.   No murmur heard. Pulmonary/Chest: Effort normal and breath sounds normal. He has no wheezes. He exhibits no tenderness.  Abdominal: Soft. Bowel sounds are normal. There is no tenderness.  Musculoskeletal: Normal range of motion. He exhibits no edema and no tenderness.  Neurological: He is alert and oriented to person, place, and time.  Skin: Skin is warm and dry.  Psychiatric: His behavior is normal.    ED Course  Procedures (including critical care time)  Labs Reviewed  CBC -  Abnormal; Notable for the following:    HCT 38.4 (*)    All other components within normal limits  BASIC METABOLIC PANEL - Abnormal; Notable for the following:    Glucose, Bld 117 (*)    GFR calc non Af Amer 72 (*)    GFR calc Af Amer 84 (*)    All other components within normal limits  POCT I-STAT, CHEM 8 - Abnormal; Notable for the following:    Glucose, Bld 121 (*)    All other components within normal limits  DIFFERENTIAL   POCT I-STAT TROPONIN I  POCT I-STAT TROPONIN I   Dg Chest 2 View  08/06/2011  *RADIOLOGY REPORT*  Clinical Data: 36 year old male with chest pain, shortness of breath, chest tightness, left shoulder pain.  CHEST - 2 VIEW  Comparison: 05/12/2011 and earlier.  Findings: Stable lung volumes, within normal limits.  Cardiac size at the upper limits of normal and stable. Other mediastinal contours are within normal limits.  Visualized tracheal air column is within normal limits.  The lungs are clear.  No pneumothorax or effusion. No acute osseous abnormality identified.  IMPRESSION: No acute cardiopulmonary abnormality.  Original Report Authenticated By: Harley Hallmark, M.D.     1. Nonspecific chest pain     ED ECG REPORT   Date: 08/06/2011  EKG Time: 4:09 AM  Rate95  Rhythm: normal sinus rhythm,  normal EKG, normal sinus rhythm, unchanged from previous tracings  Axis: normal  Intervals:none  ST&T Change: non specific T wave abnormality  Narrative Interpretation: abnormal    Patient has been sleeping after the administration of, Toradol.  He's had 2 negative sets of cardiac markers, 3, and a half hours apart.  EKG has been unchanged as well as his chest x-ray being normal         MDM  Will check cardiac labs EKG, chest xray         Arman Filter, NP 08/06/11 0409  Arman Filter, NP 08/06/11 [phone]

## 2011-08-06 NOTE — ED Notes (Signed)
Patient complaining of mid-sternal chest pain that started around 1800 this night.  Pain radiates across left side and right side of chest.  Patient reports shortness of breath, but denies nausea, vomiting, and diaphoresis.  Denies cardiac history.

## 2011-08-06 NOTE — Discharge Instructions (Signed)
Chest Pain (Nonspecific) Chest pain has many causes. Your pain could be caused by something serious, such as a heart attack or a blood clot in the lungs. It could also be caused by something less serious, such as a chest bruise or a virus. Follow up with your doctor. More lab tests or other studies may be needed to find the cause of your pain. Most of the time, nonspecific chest pain will improve within 2 to 3 days of rest and mild pain medicine. HOME CARE  For chest bruises, you may put ice on the sore area for 15 to 20 minutes, 3 to 4 times a day. Do this only if it makes you or your child feel better.   Put ice in a plastic bag.   Place a towel between the skin and the bag.   Rest for the next 2 to 3 days.   Go back to work if the pain improves.   See your doctor if the pain lasts longer than 1 to 2 weeks.   Only take medicine as told by your doctor.   Quit smoking if you smoke.  GET HELP RIGHT AWAY IF:   There is more pain or pain that spreads to the arm, neck, jaw, back, or belly (abdomen).   You or your child has shortness of breath.   You or your child coughs more than usual or coughs up blood.   You or your child has very bad back or belly pain, feels sick to his or her stomach (nauseous), or throws up (vomits).   You or your child has very bad weakness.   You or your child passes out (faints).   You or your child has a temperature by mouth above 102 F (38.9 C), not controlled by medicine.  Any of these problems may be serious and may be an emergency. Do not wait to see if the problems will go away. Get medical help right away. Call your local emergency services 911 in [place] Do not drive yourself to the hospital. MAKE SURE YOU:   Understand these instructions.   Will watch this condition.   Will get help right away if you or your child is not doing well or gets worse.  Document Released: 09/06/2007 Document Revised: 03/09/2011 Document Reviewed:  09/06/2007 [facility] Patient Information 2012 [place], [place]. Tonight, your testing was evaluated with 2 sets of cardiac markers, which are both negative.  EKG is unchanged as well as your x-ray.  Please make an plan with Dr. Gladis Riffle for followup

## 2011-08-06 NOTE — ED Notes (Signed)
Patient snoring when entering the room to give the pain med.  Patient woke up and stated he wanted to know why he had chest pain.  Stated we are still waiting for the results to come back

## 2011-11-11 ENCOUNTER — Encounter (HOSPITAL_COMMUNITY): Payer: Self-pay | Admitting: *Deleted

## 2011-11-11 ENCOUNTER — Emergency Department (HOSPITAL_COMMUNITY): Payer: Medicaid Other

## 2011-11-11 ENCOUNTER — Emergency Department (HOSPITAL_COMMUNITY)
Admission: EM | Admit: 2011-11-11 | Discharge: 2011-11-11 | Disposition: A | Discharge status: Home / Self Care | Payer: Medicaid Other | Attending: Emergency Medicine | Admitting: Emergency Medicine

## 2011-11-11 DIAGNOSIS — J069 Acute upper respiratory infection, unspecified: Secondary | ICD-10-CM | POA: Insufficient documentation

## 2011-11-11 DIAGNOSIS — F17200 Nicotine dependence, unspecified, uncomplicated: Secondary | ICD-10-CM | POA: Insufficient documentation

## 2011-11-11 DIAGNOSIS — I10 Essential (primary) hypertension: Secondary | ICD-10-CM | POA: Insufficient documentation

## 2011-11-11 DIAGNOSIS — J189 Pneumonia, unspecified organism: Secondary | ICD-10-CM | POA: Insufficient documentation

## 2011-11-11 DIAGNOSIS — E669 Obesity, unspecified: Secondary | ICD-10-CM | POA: Insufficient documentation

## 2011-11-11 HISTORY — DX: Obesity, unspecified: E66.9

## 2011-11-11 LAB — CBC WITH DIFFERENTIAL/PLATELET
Basophils Absolute: 0 10*3/uL (ref 0.0–0.1)
Eosinophils Absolute: 0.1 10*3/uL (ref 0.0–0.7)
Eosinophils Relative: 1 % (ref 0–5)
Lymphocytes Relative: 15 % (ref 12–46)
MCV: 88.5 fL (ref 78.0–100.0)
Neutrophils Relative %: 70 % (ref 43–77)
Platelets: 186 10*3/uL (ref 150–400)
RDW: 13.3 % (ref 11.5–15.5)
WBC: 7.8 10*3/uL (ref 4.0–10.5)

## 2011-11-11 LAB — POCT I-STAT, CHEM 8
Calcium, Ion: 1.24 mmol/L — ABNORMAL HIGH (ref 1.12–1.23)
HCT: 45 % (ref 39.0–52.0)
TCO2: 23 mmol/L (ref 0–100)

## 2011-11-11 LAB — POCT I-STAT TROPONIN I: Troponin i, poc: 0 ng/mL (ref 0.00–0.08)

## 2011-11-11 LAB — PRO B NATRIURETIC PEPTIDE: Pro B Natriuretic peptide (BNP): 15.5 pg/mL (ref 0–125)

## 2011-11-11 MED ORDER — CHLORPHENIRAMINE-ACETAMINOPHEN 2-325 MG PO TABS: 2.0000 | ORAL_TABLET | Freq: Four times a day (QID) | ORAL | Status: DC | PRN

## 2011-11-11 MED ORDER — AZITHROMYCIN 250 MG PO TABS: 250.0000 mg | ORAL_TABLET | Freq: Every day | ORAL | Status: AC

## 2011-11-11 MED ORDER — AZITHROMYCIN 250 MG PO TABS
500.0000 mg | ORAL_TABLET | Freq: Once | ORAL | Status: AC
  Administered 2011-11-11: 500 mg via ORAL
  Filled 2011-11-11: qty 2

## 2011-11-11 MED ORDER — ASPIRIN 81 MG PO CHEW
324.0000 mg | CHEWABLE_TABLET | Freq: Once | ORAL | Status: AC
  Administered 2011-11-11: 324 mg via ORAL
  Filled 2011-11-11: qty 4

## 2011-11-11 MED ORDER — OXYMETAZOLINE HCL 0.05 % NA SOLN: 2.0000 | Freq: Two times a day (BID) | NASAL | Status: AC

## 2011-11-11 NOTE — ED Notes (Signed)
C/o CP and sob, seems to be more sob, subjective "hard to breathe in", "difficult to describe", some nausea. has missed a few days of his BP meds, LS CTA, pitting edema noted in BLE. Smoker. Alert, NAD, calm, interactive.

## 2011-11-11 NOTE — ED Notes (Signed)
Pt. Reports awakening from sleep with SOB x 2 episodes this morning. States "when I was walking around I got short of breath. That doesn't usually happen". Pt. Complains of chest pain described as "a sharp prick on the left side", non radiating,  5/10. Reports "feeling nauseate earlier, but not right. Denies headaches. Denies vision changes. Denies any other pain. Reports being diagnoses with Sleep Apnea by a sleep study in [place] last year. Pt. Relocated to [facility] and was unable to follow up with the [facility] plan of care. Pt does not use CPAP or BIPAP.    Per Wife: Pt was asleep, laying on his stomach. He started gasping of air. He got on his knees and was gasping for 2-46min. He sat up in be for apporx then drifted back to sleep. He began to gasp again, so I woke him up. Then we called 911.

## 2011-11-11 NOTE — ED Provider Notes (Signed)
History     CSN: [id number]  Arrival date & time 11/11/11  4098   First MD Initiated Contact with Patient 11/11/11 [phone]      Chief Complaint  Patient presents with  . Chest Pain  . Shortness of Breath    (Consider location/radiation/quality/duration/timing/severity/associated sxs/prior treatment) HPI PT with several days of sinus congestion and drainage but no cough or fever reports he woke up during the night with a sensation of choking, having difficulty getting air in and gasping for breath. He recovered after a few minutes before falling back asleep in a recliner but then had another similar episode shortly after. He reports some mild chest soreness now. No SOB when awake and resting. He has not had any new edema to legs. No known CAD. He took benadryl last night before bed to help with congestion.  Past Medical History  Diagnosis Date  . Hypertension   . Obesity     History reviewed. No pertinent past surgical history.  History reviewed. No pertinent family history.  History  Substance Use Topics  . Smoking status: Current Everyday Smoker  . Smokeless tobacco: Not on file  . Alcohol Use: No      Review of Systems All other systems reviewed and are negative except as noted in HPI.   Allergies  Review of patient's allergies indicates no known allergies.  Home Medications   Current Outpatient Rx  Name Route Sig Dispense Refill  . AMLODIPINE BESYLATE 10 MG PO TABS Oral Take 10 mg by mouth daily.    Marland Kitchen DIPHENHYDRAMINE HCL 25 MG PO TABS Oral Take 25-50 mg by mouth every 6 (six) hours as needed. Allergy symptoms    . FUROSEMIDE 20 MG PO TABS Oral Take 20 mg by mouth 2 (two) times daily.    Marland Kitchen LORATADINE 10 MG PO TABS Oral Take 10 mg by mouth daily as needed. allergies      BP 161/99  Pulse 90  Temp 98.2 F (36.8 C) (Oral)  Resp 19  SpO2 97%  Physical Exam  Nursing note and vitals reviewed. Constitutional: He is oriented to person, place, and time. He appears  well-developed and well-nourished.  HENT:  Head: Normocephalic and atraumatic.  Eyes: EOM are normal. Pupils are equal, round, and reactive to light.  Neck: Normal range of motion. Neck supple.  Cardiovascular: Normal rate, normal heart sounds and intact distal pulses.   Pulmonary/Chest: Effort normal and breath sounds normal.  Abdominal: Bowel sounds are normal. He exhibits no distension. There is no tenderness.  Musculoskeletal: Normal range of motion. He exhibits edema (Trace edema of LE bilaterally). He exhibits no tenderness.  Neurological: He is alert and oriented to person, place, and time. He has normal strength. No cranial nerve deficit or sensory deficit.  Skin: Skin is warm and dry. No rash noted.  Psychiatric: He has a normal mood and affect.    ED Course  Procedures (including critical care time)  Labs Reviewed  CBC WITH DIFFERENTIAL - Abnormal; Notable for the following:    HCT 38.3 (*)     Monocytes Relative 14 (*)     Monocytes Absolute 1.1 (*)     All other components within normal limits  POCT I-STAT, CHEM 8 - Abnormal; Notable for the following:    Glucose, Bld 120 (*)     Calcium, Ion 1.24 (*)     All other components within normal limits  PRO B NATRIURETIC PEPTIDE  POCT I-STAT TROPONIN I   Dg Chest  2 View  11/11/2011  *RADIOLOGY REPORT*  Clinical Data: Shortness of breath  CHEST - 2 VIEW  Comparison: 08/06/2011  Findings: Right infrahilar opacity and bilateral peribronchial thickening.  Heart size upper normal.  Mild aortic unfolding without aneurysmal dilatation.  No pleural effusion or pneumothorax.  No acute osseous finding.  IMPRESSION:  Peribronchial thickening may represent bronchitis or viral infection.  More confluent right infrahilar opacity may reflect the same process or developing pneumonia.  Original Report Authenticated By: Waneta Martins, M.D.     No diagnosis found.    MDM   Date: 11/11/2011  Rate: 88  Rhythm: normal sinus rhythm   QRS Axis: normal  Intervals: normal  ST/T Wave abnormalities: normal  Conduction Disutrbances: none  Narrative Interpretation: unremarkable  Pt with normal EKG and troponin. No concern for ACS. I suspect he had sinus drainage, combined with relaxed pharynx from Benadryl causing a choking spell. CXR concerning for developing pneumonia correlates with this. His SpO2 on room air is normal. Lung sounds are normal. Advised to use decongestants safe for HTN, avoid sedating meds/EtOH. Will start Abx to cover CAP.           Marshell Dilauro B. Bernette Mayers, MD 11/11/11 [phone]

## 2011-11-11 NOTE — ED Notes (Signed)
Pt discharged home. Had no further questions. Encouraged to follow up with PCP is symptoms worsen.

## 2011-11-11 NOTE — ED Notes (Signed)
Phlebotomist at bedside.

## 2011-12-26 ENCOUNTER — Encounter (HOSPITAL_COMMUNITY): Payer: Self-pay | Admitting: Emergency Medicine

## 2011-12-26 ENCOUNTER — Emergency Department (HOSPITAL_COMMUNITY)
Admission: EM | Admit: 2011-12-26 | Discharge: 2011-12-26 | Disposition: A | Discharge status: Home / Self Care | Payer: Medicaid Other | Attending: Emergency Medicine | Admitting: Emergency Medicine

## 2011-12-26 DIAGNOSIS — L02412 Cutaneous abscess of left axilla: Secondary | ICD-10-CM

## 2011-12-26 DIAGNOSIS — F17200 Nicotine dependence, unspecified, uncomplicated: Secondary | ICD-10-CM | POA: Insufficient documentation

## 2011-12-26 DIAGNOSIS — E669 Obesity, unspecified: Secondary | ICD-10-CM | POA: Insufficient documentation

## 2011-12-26 DIAGNOSIS — I10 Essential (primary) hypertension: Secondary | ICD-10-CM | POA: Insufficient documentation

## 2011-12-26 DIAGNOSIS — IMO0002 Reserved for concepts with insufficient information to code with codable children: Secondary | ICD-10-CM | POA: Insufficient documentation

## 2011-12-26 MED ORDER — SULFAMETHOXAZOLE-TRIMETHOPRIM 800-160 MG PO TABS: 1.0000 | ORAL_TABLET | Freq: Two times a day (BID) | ORAL | Status: DC

## 2011-12-26 MED ORDER — HYDROCODONE-ACETAMINOPHEN 5-325 MG PO TABS: ORAL_TABLET | ORAL | Status: DC

## 2011-12-26 MED ORDER — HYDROCODONE-ACETAMINOPHEN 5-325 MG PO TABS
2.0000 | ORAL_TABLET | Freq: Once | ORAL | Status: AC
  Administered 2011-12-26: 2 via ORAL
  Filled 2011-12-26: qty 2

## 2011-12-26 MED ORDER — CEPHALEXIN 500 MG PO CAPS: 500.0000 mg | ORAL_CAPSULE | Freq: Three times a day (TID) | ORAL | Status: DC

## 2011-12-26 NOTE — ED Provider Notes (Signed)
History  This chart was scribed for Henry Morrow. Oletta Lamas, MD by Ladona Ridgel Day. This patient was seen in room TR07C/TR07C and the patient's care was started at 1010.   CSN: [id number]  Arrival date & time 12/26/11  1010   First MD Initiated Contact with Patient 12/26/11 1058      Chief Complaint  Patient presents with  . Abscess   The history is provided by the patient. No language interpreter was used.   Henry Morrow is a 36 y.o. male who presents to the Emergency Department complaining of abscess of his left axilla with increased swelling and pain since 4 days ago. He denies any fevers or previously having an abscess drained. He has no allergies. Hurts worse with palpation or movement of left upper arm.    Past Medical History  Diagnosis Date  . Hypertension   . Obesity     History reviewed. No pertinent past surgical history.  History reviewed. No pertinent family history.  History  Substance Use Topics  . Smoking status: Current Every Day Smoker  . Smokeless tobacco: Not on file  . Alcohol Use: No      Review of Systems  Constitutional: Negative for fever and chills.  Respiratory: Negative for shortness of breath.   Gastrointestinal: Negative for nausea.  Skin:       Painful abscess of his left axilla  Neurological: Negative for weakness.  Hematological: Negative for adenopathy.    Allergies  Review of patient's allergies indicates no known allergies.  Home Medications   Current Outpatient Rx  Name Route Sig Dispense Refill  . AMLODIPINE BESYLATE 10 MG PO TABS Oral Take 10 mg by mouth daily.    . FUROSEMIDE 20 MG PO TABS Oral Take 20 mg by mouth daily.     . CEPHALEXIN 500 MG PO CAPS Oral Take 1 capsule (500 mg total) by mouth 3 (three) times daily. 30 capsule 0  . HYDROCODONE-ACETAMINOPHEN 5-325 MG PO TABS  1-2 tablets po q 6 hours prn moderate to severe pain 20 tablet 0  . SULFAMETHOXAZOLE-TRIMETHOPRIM 800-160 MG PO TABS Oral Take 1 tablet by mouth 2 (two)  times daily. 20 tablet 0    Triage Vitals: BP 161/98  Pulse 101  Temp 98 F (36.7 C) (Oral)  Resp 18  SpO2 99%  Physical Exam  Nursing note and vitals reviewed. Constitutional: He is oriented to person, place, and time. He appears well-developed and well-nourished. No distress.  HENT:  Head: Normocephalic and atraumatic.  Eyes: EOM are normal.  Neck: Neck supple. No tracheal deviation present.  Cardiovascular: Normal rate.   Pulmonary/Chest: Effort normal. No respiratory distress.  Musculoskeletal: Normal range of motion.  Neurological: He is alert and oriented to person, place, and time.  Skin: Skin is warm and dry.       Left axilla with a quarter sized area of induration, middle comes to a head without drainage, mild erythema. No axillary adenopathy  Psychiatric: He has a normal mood and affect. His behavior is normal.    ED Course  INCISION AND DRAINAGE Date/Time: 12/26/2011 11:38 AM Performed by: Lear Ng. Authorized by: Lear Ng Consent: Verbal consent obtained. Risks and benefits: risks, benefits and alternatives were discussed Consent given by: patient Patient understanding: patient states understanding of the procedure being performed Patient consent: the patient's understanding of the procedure matches consent given Procedure consent: procedure consent matches procedure scheduled Patient identity confirmed: verbally with patient Time out: Immediately prior to procedure a "  time out" was called to verify the correct patient, procedure, equipment, support staff and site/side marked as required. Type: abscess Body area: upper extremity Location details: chest Anesthesia: local infiltration Local anesthetic: lidocaine 2% without epinephrine Anesthetic total: 1 ml Patient sedated: no Scalpel size: 11 Incision type: single straight Complexity: simple Drainage: purulent Drainage amount: moderate Wound treatment: wound left open Packing material: 1/4  in iodoform gauze Patient tolerance: Patient tolerated the procedure well with no immediate complications. Comments: Left axilla    (including critical care time) DIAGNOSTIC STUDIES: Oxygen Saturation is 99% on room air, normal by my interpretation.    COORDINATION OF CARE: At 1105 AM Discussed treatment plan with patient which includes antibiotics and I&D procedure. Patient agrees.   Labs Reviewed - No data to display No results found.   1. Abscess of axilla, left       MDM  I personally performed the services described in this documentation, which was scribed in my presence. The recorded information has been reviewed and considered.    Abscess of left axilla, indurated, firm, slightly fluctuant.  No systemic fevers.  Pt agrees to have I&D           Henry Morrow. Anisia Leija, MD 12/26/11 1143

## 2011-12-26 NOTE — ED Notes (Signed)
Pt c/o abscess under left axillary area x 4 days

## 2011-12-26 NOTE — Discharge Instructions (Signed)
Abscess Care After An abscess (also called a boil or furuncle) is an infected area that contains a collection of pus. Signs and symptoms of an abscess include pain, tenderness, redness, or hardness, or you may feel a moveable soft area under your skin. An abscess can occur anywhere in the body. The infection may spread to surrounding tissues causing cellulitis. A cut (incision) by the surgeon was made over your abscess and the pus was drained out. Gauze may have been packed into the space to provide a drain that will allow the cavity to heal from the inside outwards. The boil may be painful for 5 to 7 days. Most people with a boil do not have high fevers. Your abscess, if seen early, may not have localized, and may not have been lanced. If not, another appointment may be required for this if it does not get better on its own or with medications. HOME CARE INSTRUCTIONS   Only take over-the-counter or prescription medicines for pain, discomfort, or fever as directed by your caregiver.   When you bathe, soak and then remove gauze or iodoform packs at least daily or as directed by your caregiver. You may then wash the wound gently with mild soapy water. Repack with gauze or do as your caregiver directs.  SEEK IMMEDIATE MEDICAL CARE IF:   You develop increased pain, swelling, redness, drainage, or bleeding in the wound site.   You develop signs of generalized infection including muscle aches, chills, fever, or a general ill feeling.   An oral temperature above 102 F (38.9 C) develops, not controlled by medication.  See your caregiver for a recheck if you develop any of the symptoms described above. If medications (antibiotics) were prescribed, take them as directed. Document Released: 10/06/2004 Document Revised: 03/09/2011 Document Reviewed: 06/03/2007 [facility] Patient Information 2012 [place], [place].    Narcotic and benzodiazepine use may cause drowsiness, slowed breathing or dependence.  Please  use with caution and do not drive, operate machinery or watch young children alone while taking them.  Taking combinations of these medications or drinking alcohol will potentiate these effects.

## 2011-12-29 LAB — WOUND CULTURE

## 2011-12-30 NOTE — ED Notes (Signed)
+  Wound. Patient given Keflex and Bactrim. Bactrim: Resistant. No sensitivity on Keflex. Chart sent to EDP office for review.

## 2011-12-31 ENCOUNTER — Telehealth (HOSPITAL_COMMUNITY): Payer: Self-pay | Admitting: Emergency Medicine

## 2012-04-02 ENCOUNTER — Emergency Department (HOSPITAL_COMMUNITY)
Admission: EM | Admit: 2012-04-02 | Discharge: 2012-04-03 | Disposition: A | Discharge status: Home / Self Care | Payer: Medicaid Other | Attending: Emergency Medicine | Admitting: Emergency Medicine

## 2012-04-02 ENCOUNTER — Encounter (HOSPITAL_COMMUNITY): Payer: Self-pay | Admitting: Emergency Medicine

## 2012-04-02 DIAGNOSIS — E669 Obesity, unspecified: Secondary | ICD-10-CM | POA: Insufficient documentation

## 2012-04-02 DIAGNOSIS — I10 Essential (primary) hypertension: Secondary | ICD-10-CM | POA: Insufficient documentation

## 2012-04-02 DIAGNOSIS — J019 Acute sinusitis, unspecified: Secondary | ICD-10-CM | POA: Insufficient documentation

## 2012-04-02 DIAGNOSIS — F17200 Nicotine dependence, unspecified, uncomplicated: Secondary | ICD-10-CM | POA: Insufficient documentation

## 2012-04-02 DIAGNOSIS — G4730 Sleep apnea, unspecified: Secondary | ICD-10-CM | POA: Insufficient documentation

## 2012-04-02 HISTORY — DX: Sleep apnea, unspecified: G47.30

## 2012-04-02 NOTE — ED Notes (Signed)
PT. REPORTS NASAL CONGESTION FOR SEVERAL DAYS UNRELIEVED BY OTC NASAL SPRAY , DENIES COUGH OR CONGESTION . NO FEVER OR CHILLS.

## 2012-04-03 MED ORDER — FLUTICASONE PROPIONATE 50 MCG/ACT NA SUSP: 2.0000 | Freq: Every day | NASAL | Status: DC

## 2012-04-03 MED ORDER — AMOXICILLIN 500 MG PO CAPS: 500.0000 mg | ORAL_CAPSULE | Freq: Three times a day (TID) | ORAL | Status: DC

## 2012-04-03 NOTE — ED Notes (Signed)
Denies pain, "just nasal congestion/pressure", given Rx x2, out to d/c desk with family, steady gait, declined w/c, (denies: dizziness, pain, nausea or sob).

## 2012-04-03 NOTE — ED Provider Notes (Signed)
History     CSN: [id number]  Arrival date & time 04/02/12  2257   First MD Initiated Contact with Patient 04/03/12 [phone]      Chief Complaint  Patient presents with  . Nasal Congestion    (Consider location/radiation/quality/duration/timing/severity/associated sxs/prior treatment) HPI  Henry Morrow is a 37 y.o. male complaining of complaining of nasal congestion worsening over the course of several days. He denies fever, cough, sore throat, chest pain, shortness of breath, abdominal pain, vomiting, change in bowel or bladder habits.   Past Medical History  Diagnosis Date  . Hypertension   . Obesity   . Sleep apnea     Past Surgical History  Procedure Date  . Hernia repair     No family history on file.  History  Substance Use Topics  . Smoking status: Current Every Day Smoker  . Smokeless tobacco: Not on file  . Alcohol Use: No      Review of Systems  Constitutional: Negative for fever.  HENT: Positive for congestion.   Respiratory: Negative for shortness of breath.   Cardiovascular: Negative for chest pain.  Gastrointestinal: Negative for nausea, vomiting, abdominal pain and diarrhea.  All other systems reviewed and are negative.    Allergies  Review of patient's allergies indicates no known allergies.  Home Medications   Current Outpatient Rx  Name  Route  Sig  Dispense  Refill  . AMLODIPINE BESYLATE 10 MG PO TABS   Oral   Take 10 mg by mouth daily.         . CEPHALEXIN 500 MG PO CAPS   Oral   Take 1 capsule (500 mg total) by mouth 3 (three) times daily.   30 capsule   0   . FUROSEMIDE 20 MG PO TABS   Oral   Take 20 mg by mouth daily.          Marland Kitchen HYDROCODONE-ACETAMINOPHEN 5-325 MG PO TABS      1-2 tablets po q 6 hours prn moderate to severe pain   20 tablet   0   . SULFAMETHOXAZOLE-TRIMETHOPRIM 800-160 MG PO TABS   Oral   Take 1 tablet by mouth 2 (two) times daily.   20 tablet   0     BP 152/97  Pulse 103  Temp 98 F  (36.7 C) (Oral)  Resp 14  SpO2 98%  Physical Exam  Nursing note and vitals reviewed. Constitutional: He is oriented to person, place, and time. He appears well-developed and well-nourished. No distress.  HENT:  Head: Normocephalic.  Mouth/Throat: Oropharynx is clear and moist.       Palpation of bilateral maxillary sinuses, oropharynx shows posterior injection.  Eyes: Conjunctivae normal and EOM are normal. Pupils are equal, round, and reactive to light.  Neck: Normal range of motion.  Cardiovascular: Normal rate, regular rhythm and intact distal pulses.   Pulmonary/Chest: Effort normal and breath sounds normal. No stridor. No respiratory distress. He has no wheezes. He has no rales. He exhibits no tenderness.  Abdominal: Soft.  Musculoskeletal: Normal range of motion.  Lymphadenopathy:    He has no cervical adenopathy.  Neurological: He is alert and oriented to person, place, and time.  Psychiatric: He has a normal mood and affect.    ED Course  Procedures (including critical care time)  Labs Reviewed - No data to display No results found.   1. Acute sinus infection       MDM   Pt verbalized understanding and agrees with  care plan. Outpatient follow-up and return precautions given.    New Prescriptions   AMOXICILLIN (AMOXIL) 500 MG CAPSULE    Take 1 capsule (500 mg total) by mouth 3 (three) times daily.   FLUTICASONE (FLONASE) 50 MCG/ACT NASAL SPRAY    Place 2 sprays into the nose daily.          Wynetta Emery, PA-C 04/04/12 0559

## 2012-04-03 NOTE — ED Notes (Signed)
EDPA into room, alert, NAD, calm, interactive, skin W&D, resps e/u, dpeakingin clear complete settnences, speech mildly alterred d/t congestion, also has significantly swollen tonsils, red inflamed w/o obvious exudate. No relief with afrin or loratidine. H/o sleep apnea. Pt obese. "Concerned for sinus infection". Reports nasal congestion thick yellow pasty.

## 2012-04-04 NOTE — ED Provider Notes (Signed)
Medical screening examination/treatment/procedure(s) were performed by non-physician practitioner and as supervising physician I was immediately available for consultation/collaboration.   Joya Gaskins, MD 04/04/12 [phone]

## 2012-06-25 ENCOUNTER — Emergency Department (HOSPITAL_COMMUNITY): Payer: Medicaid Other

## 2012-06-25 ENCOUNTER — Emergency Department (HOSPITAL_COMMUNITY)
Admission: EM | Admit: 2012-06-25 | Discharge: 2012-06-26 | Disposition: A | Discharge status: Home / Self Care | Payer: Medicaid Other | Attending: Emergency Medicine | Admitting: Emergency Medicine

## 2012-06-25 ENCOUNTER — Encounter (HOSPITAL_COMMUNITY): Payer: Self-pay | Admitting: Emergency Medicine

## 2012-06-25 DIAGNOSIS — I10 Essential (primary) hypertension: Secondary | ICD-10-CM | POA: Insufficient documentation

## 2012-06-25 DIAGNOSIS — Z79899 Other long term (current) drug therapy: Secondary | ICD-10-CM | POA: Insufficient documentation

## 2012-06-25 DIAGNOSIS — G4730 Sleep apnea, unspecified: Secondary | ICD-10-CM | POA: Insufficient documentation

## 2012-06-25 DIAGNOSIS — E669 Obesity, unspecified: Secondary | ICD-10-CM | POA: Insufficient documentation

## 2012-06-25 DIAGNOSIS — R079 Chest pain, unspecified: Secondary | ICD-10-CM | POA: Insufficient documentation

## 2012-06-25 DIAGNOSIS — F17200 Nicotine dependence, unspecified, uncomplicated: Secondary | ICD-10-CM | POA: Insufficient documentation

## 2012-06-25 LAB — COMPREHENSIVE METABOLIC PANEL
ALT: 19 U/L (ref 0–53)
Albumin: 3.7 g/dL (ref 3.5–5.2)
Alkaline Phosphatase: 54 U/L (ref 39–117)
Potassium: 3.8 mEq/L (ref 3.5–5.1)
Sodium: 136 mEq/L (ref 135–145)
Total Protein: 7.6 g/dL (ref 6.0–8.3)

## 2012-06-25 LAB — CBC WITH DIFFERENTIAL/PLATELET
Basophils Relative: 0 % (ref 0–1)
Eosinophils Absolute: 0.1 10*3/uL (ref 0.0–0.7)
MCH: 30.3 pg (ref 26.0–34.0)
MCHC: 35.7 g/dL (ref 30.0–36.0)
Neutrophils Relative %: 65 % (ref 43–77)
Platelets: 202 10*3/uL (ref 150–400)
RBC: 4.55 MIL/uL (ref 4.22–5.81)

## 2012-06-25 LAB — POCT I-STAT TROPONIN I: Troponin i, poc: 0 ng/mL (ref 0.00–0.08)

## 2012-06-25 MED ORDER — AMLODIPINE BESYLATE 10 MG PO TABS: 10.0000 mg | ORAL_TABLET | Freq: Every day | ORAL | Status: DC

## 2012-06-25 MED ORDER — AMLODIPINE BESYLATE 10 MG PO TABS
10.0000 mg | ORAL_TABLET | Freq: Once | ORAL | Status: AC
  Administered 2012-06-25: 10 mg via ORAL
  Filled 2012-06-25: qty 1

## 2012-06-25 MED ORDER — ACETAMINOPHEN 325 MG PO TABS
650.0000 mg | ORAL_TABLET | Freq: Once | ORAL | Status: AC
  Administered 2012-06-25: 650 mg via ORAL
  Filled 2012-06-25: qty 2

## 2012-06-25 NOTE — ED Notes (Signed)
Patient resting on stretcher at this time. Patient provided drink, verified order with MD. Patient states his chest pain has subsided at this time. Still complaining of a headache and rates pain a 4/10. resp are even and unlabored, no acute distress noted. Call light at bedside. Will continue to monitor.

## 2012-06-25 NOTE — ED Provider Notes (Signed)
History     CSN: [id number]  Arrival date & time 06/25/12  2142   First MD Initiated Contact with Patient 06/25/12 2243      Chief Complaint  Patient presents with  . Chest Pain    (Consider location/radiation/quality/duration/timing/severity/associated sxs/prior treatment) HPI Comments: Patient presents for L sided chest pain x 5-6 days that is intermittent and lasts approximately 5-10 minutes before subsiding. Patient denies aggravating or alleviating factors. He admits to approximately 2 frontal, throbbing headaches that are gradual in onset over the last few days, denying any association with the timing of his chest discomfort. Patient has been off his HTN medication for approximately 3 weeks as he is in the process of switching primary care providers.  Patient is a 37 y.o. male presenting with chest pain. The history is provided by the patient. No language interpreter was used.  Chest Pain Pain location:  L chest Pain quality: aching   Pain quality: not radiating   Pain severity now: worse in severity than previous episodes. Duration:  10 minutes Timing:  Intermittent Context: not breathing, not lifting, not raising an arm and no trauma   Ineffective treatments:  None tried Associated symptoms: anxiety and headache   Associated symptoms: no abdominal pain, no back pain, no diaphoresis, no dizziness, no fever, no lower extremity edema, no nausea, no numbness, no palpitations, no shortness of breath, no syncope, not vomiting and no weakness   Risk factors: hypertension, male sex and obesity   Risk factors: no diabetes mellitus, no high cholesterol and no smoking     Past Medical History  Diagnosis Date  . Hypertension   . Obesity   . Sleep apnea     Past Surgical History  Procedure Laterality Date  . Hernia repair      No family history on file.  History  Substance Use Topics  . Smoking status: Current Every Day Smoker  . Smokeless tobacco: Not on file  . Alcohol  Use: No      Review of Systems  Constitutional: Negative for fever and diaphoresis.  HENT: Negative for hearing loss, congestion, rhinorrhea, neck pain, neck stiffness and tinnitus.   Eyes: Negative for photophobia and pain.  Respiratory: Negative for shortness of breath and wheezing.   Cardiovascular: Positive for chest pain. Negative for palpitations and syncope.  Gastrointestinal: Negative for nausea, vomiting, abdominal pain and diarrhea.  Musculoskeletal: Negative for back pain.  Skin: Negative for color change.  Neurological: Positive for headaches. Negative for dizziness, syncope, weakness, light-headedness and numbness.  All other systems reviewed and are negative.    Allergies  Review of patient's allergies indicates no known allergies.  Home Medications   Current Outpatient Rx  Name  Route  Sig  Dispense  Refill  . amLODipine (NORVASC) 10 MG tablet   Oral   Take 10 mg by mouth daily.         . furosemide (LASIX) 20 MG tablet   Oral   Take 20 mg by mouth daily.          Marland Kitchen amLODipine (NORVASC) 10 MG tablet   Oral   Take 1 tablet (10 mg total) by mouth daily.   30 tablet   0     BP 133/92  Pulse 94  Temp(Src) 98.7 F (37.1 C) (Oral)  Resp 18  SpO2 98%  Physical Exam  Nursing note and vitals reviewed. Constitutional: He is oriented to person, place, and time. No distress.  Morbidly obese male, lying in bed;  comfortable appearing in NAD  HENT:  Head: Normocephalic and atraumatic.  Right Ear: External ear normal.  Left Ear: External ear normal.  Mouth/Throat: Oropharynx is clear and moist.  Symmetric rise of the uvula with phonation  Eyes: Conjunctivae and EOM are normal. Pupils are equal, round, and reactive to light. No scleral icterus.  Neck: Normal range of motion. Neck supple.  Cardiovascular: Normal rate, regular rhythm, normal heart sounds and intact distal pulses.   Capillary refill <3 seconds  Pulmonary/Chest: Effort normal and breath  sounds normal. No respiratory distress. He has no wheezes. He has no rales. He exhibits tenderness. He exhibits no bony tenderness, no crepitus, no edema, no deformity and no swelling.    Mild tenderness on palpation of L chest wall  Abdominal: Soft. He exhibits no distension. There is no tenderness. There is no rebound and no guarding.  Musculoskeletal: Normal range of motion.  Lymphadenopathy:    He has no cervical adenopathy.  Neurological: He is alert and oriented to person, place, and time.  CN II-XII grossly intact; Snellen 20/20 OS, OD, OU. No sensory or motor deficits. Patient exhibits full ROM of extremities with 5/5 strength against resistance.  Skin: Skin is warm and dry. He is not diaphoretic.  Psychiatric: He has a normal mood and affect. His behavior is normal.    ED Course  Procedures (including critical care time)  Results for orders placed during the hospital encounter of 06/25/12  CBC WITH DIFFERENTIAL      Result Value Range   WBC 7.7  4.0 - 10.5 K/uL   RBC 4.55  4.22 - 5.81 MIL/uL   Hemoglobin 13.8  13.0 - 17.0 g/dL   HCT 40.9 (*) 81.1 - 91.4 %   MCV 85.1  78.0 - 100.0 fL   MCH 30.3  26.0 - 34.0 pg   MCHC 35.7  30.0 - 36.0 g/dL   RDW 78.2  95.6 - 21.3 %   Platelets 202  150 - 400 K/uL   Neutrophils Relative 65  43 - 77 %   Neutro Abs 5.0  1.7 - 7.7 K/uL   Lymphocytes Relative 27  12 - 46 %   Lymphs Abs 2.1  0.7 - 4.0 K/uL   Monocytes Relative 7  3 - 12 %   Monocytes Absolute 0.5  0.1 - 1.0 K/uL   Eosinophils Relative 1  0 - 5 %   Eosinophils Absolute 0.1  0.0 - 0.7 K/uL   Basophils Relative 0  0 - 1 %   Basophils Absolute 0.0  0.0 - 0.1 K/uL  COMPREHENSIVE METABOLIC PANEL      Result Value Range   Sodium 136  135 - 145 mEq/L   Potassium 3.8  3.5 - 5.1 mEq/L   Chloride 102  96 - 112 mEq/L   CO2 21  19 - 32 mEq/L   Glucose, Bld 154 (*) 70 - 99 mg/dL   BUN 11  6 - 23 mg/dL   Creatinine, Ser 0.86  0.50 - 1.35 mg/dL   Calcium 9.3  8.4 - 57.8 mg/dL    Total Protein 7.6  6.0 - 8.3 g/dL   Albumin 3.7  3.5 - 5.2 g/dL   AST 27  0 - 37 U/L   ALT 19  0 - 53 U/L   Alkaline Phosphatase 54  39 - 117 U/L   Total Bilirubin 0.5  0.3 - 1.2 mg/dL   GFR calc non Af Amer 72 (*) >90 mL/min   GFR  calc Af Amer 83 (*) >90 mL/min  POCT I-STAT TROPONIN I      Result Value Range   Troponin i, poc 0.00  0.00 - 0.08 ng/mL   Comment 3            Dg Chest 2 View  06/25/2012  *RADIOLOGY REPORT*  Clinical Data: Chest pain.  CHEST - 2 VIEW  Comparison: 11/11/2011.  Findings: The cardiac silhouette, mediastinal and hilar contours are within normal limits and stable.  The lungs are clear.  No pleural effusion.  The bony thorax is intact.  IMPRESSION: No acute cardiopulmonary findings.   Original Report Authenticated By: Rudie Meyer, M.D.     No results found.  Date: 06/25/2012  Rate: 96  Rhythm: normal sinus rhythm  QRS Axis: normal  Intervals: normal  ST/T Wave abnormalities: nonspecific T wave changes  Conduction Disutrbances:none  Narrative Interpretation: NSR with nonspecific T wave abnormalities;   Old EKG Reviewed: unchanged from 11/2011   1. Chest pain      MDM  Patient presents for chest pain as well as dull, throbbing frontal headache gradual in onset. Patient's physical exam benign with no neurologic, sensory, or motor deficits. Labs c/w prior work ups; troponin 0.0, EKG without STEMI, and CXR without findings of acute cardiopulmonary changes. Low suspicion for ACS given these findings and TIMI score of 0. Patient hemodynamically stable, well and nontoxic appearing and in NAD; smiling and laughing. States chest pain has resolved and headache is mild. Ibuprofen ordered for patient's headache. Patient stable for d/c with PCP follow up for further evaluation of symptoms. Patient will be given prescription for norvasc 10mg  daily as patient has been off his medication for the last 3 weeks. Patient states comfort and understanding with this d/c plan with no  unaddressed concerns. Indications for ED return discussed.  Filed Vitals:   06/25/12 2205 06/25/12 2245 06/25/12 2315  BP: 163/114 151/85 133/92  Pulse: 95 90 94  Temp: 98.7 F (37.1 C)    TempSrc: Oral    Resp: 18 16 18   SpO2: 100% 93% 98%           Antony Madura, PA-C 06/28/12 1957

## 2012-06-25 NOTE — ED Notes (Signed)
PT. REPORTS LEFT CHEST PAIN WITH SLIGHT SOB FOR SEVERAL DAYS WORSE THIS EVENING , DENIES NAUSEA/VOMITTING OR DIAPHORESIS .

## 2012-06-26 NOTE — ED Notes (Signed)
Patient given discharge instructions for chest pain. rx for norvasc. Advised to follow up with primary care or to return to this department if condition worsens. Patient voiced understanding of all instructions and had no further questions. Patient ambulated to front lobby without difficulty.

## 2012-06-30 NOTE — ED Provider Notes (Signed)
Medical screening examination/treatment/procedure(s) were performed by non-physician practitioner and as supervising physician I was immediately available for consultation/collaboration.   Monta Police L Jermery Caratachea, MD 06/30/12 0203 

## 2013-07-19 DIAGNOSIS — Z9889 Other specified postprocedural states: Secondary | ICD-10-CM | POA: Insufficient documentation

## 2013-07-19 DIAGNOSIS — I10 Essential (primary) hypertension: Secondary | ICD-10-CM | POA: Insufficient documentation

## 2013-07-19 DIAGNOSIS — R109 Unspecified abdominal pain: Secondary | ICD-10-CM | POA: Insufficient documentation

## 2013-07-19 DIAGNOSIS — Z79899 Other long term (current) drug therapy: Secondary | ICD-10-CM | POA: Insufficient documentation

## 2013-07-19 DIAGNOSIS — F17200 Nicotine dependence, unspecified, uncomplicated: Secondary | ICD-10-CM | POA: Insufficient documentation

## 2013-07-19 LAB — CBC WITH DIFFERENTIAL/PLATELET
BASOS ABS: 0 10*3/uL (ref 0.0–0.1)
BASOS PCT: 0 % (ref 0–1)
EOS PCT: 1 % (ref 0–5)
Eosinophils Absolute: 0.2 10*3/uL (ref 0.0–0.7)
HEMATOCRIT: 41.1 % (ref 39.0–52.0)
Hemoglobin: 14.7 g/dL (ref 13.0–17.0)
LYMPHS PCT: 23 % (ref 12–46)
Lymphs Abs: 2.5 10*3/uL (ref 0.7–4.0)
MCH: 31.7 pg (ref 26.0–34.0)
MCHC: 35.8 g/dL (ref 30.0–36.0)
MCV: 88.8 fL (ref 78.0–100.0)
MONO ABS: 0.9 10*3/uL (ref 0.1–1.0)
Monocytes Relative: 8 % (ref 3–12)
NEUTROS ABS: 7.3 10*3/uL (ref 1.7–7.7)
Neutrophils Relative %: 68 % (ref 43–77)
PLATELETS: 192 10*3/uL (ref 150–400)
RBC: 4.63 MIL/uL (ref 4.22–5.81)
RDW: 13.2 % (ref 11.5–15.5)
WBC: 10.8 10*3/uL — AB (ref 4.0–10.5)

## 2013-07-19 NOTE — ED Notes (Signed)
Pt c/o LLQ abdominal pain, onset yesterday afternoon. Pt has hx of 4 inguinal hernias on same side. Pt denies dysuria, reports hematuria x 1 episode, normal BM. Pt also c/o bump on back of neck, he is concerned it might be a spider bite. Pt A&Ox4, respirations equal and unlabored, skin warm and dry.

## 2013-07-20 ENCOUNTER — Emergency Department (HOSPITAL_COMMUNITY): Payer: Medicaid Other

## 2013-07-20 ENCOUNTER — Encounter (HOSPITAL_COMMUNITY): Payer: Self-pay | Admitting: Radiology

## 2013-07-20 ENCOUNTER — Emergency Department (HOSPITAL_COMMUNITY)
Admission: EM | Admit: 2013-07-20 | Discharge: 2013-07-20 | Disposition: A | Discharge status: Home / Self Care | Payer: Medicaid Other | Attending: Emergency Medicine | Admitting: Emergency Medicine

## 2013-07-20 DIAGNOSIS — R109 Unspecified abdominal pain: Secondary | ICD-10-CM

## 2013-07-20 LAB — COMPREHENSIVE METABOLIC PANEL
ALBUMIN: 3.7 g/dL (ref 3.5–5.2)
ALT: 29 U/L (ref 0–53)
AST: 31 U/L (ref 0–37)
Alkaline Phosphatase: 65 U/L (ref 39–117)
BUN: 13 mg/dL (ref 6–23)
CALCIUM: 9.4 mg/dL (ref 8.4–10.5)
CHLORIDE: 101 meq/L (ref 96–112)
CO2: 24 mEq/L (ref 19–32)
CREATININE: 1.36 mg/dL — AB (ref 0.50–1.35)
GFR calc Af Amer: 74 mL/min — ABNORMAL LOW (ref >90)
GFR calc non Af Amer: 64 mL/min — ABNORMAL LOW (ref >90)
Glucose, Bld: 127 mg/dL — ABNORMAL HIGH (ref 70–99)
Potassium: 4.5 mEq/L (ref 3.7–5.3)
SODIUM: 138 meq/L (ref 137–147)
Total Bilirubin: 0.4 mg/dL (ref 0.3–1.2)
Total Protein: 7.4 g/dL (ref 6.0–8.3)

## 2013-07-20 LAB — URINALYSIS, ROUTINE W REFLEX MICROSCOPIC
BILIRUBIN URINE: NEGATIVE
GLUCOSE, UA: NEGATIVE mg/dL
HGB URINE DIPSTICK: NEGATIVE
Ketones, ur: 15 mg/dL — AB
Leukocytes, UA: NEGATIVE
Nitrite: NEGATIVE
Protein, ur: 100 mg/dL — AB
SPECIFIC GRAVITY, URINE: 1.029 (ref 1.005–1.030)
Urobilinogen, UA: 1 mg/dL (ref 0.0–1.0)
pH: 5.5 (ref 5.0–8.0)

## 2013-07-20 LAB — URINE MICROSCOPIC-ADD ON

## 2013-07-20 LAB — LACTIC ACID, PLASMA: Lactic Acid, Venous: 1.3 mmol/L (ref 0.5–2.2)

## 2013-07-20 MED ORDER — IOHEXOL 300 MG/ML  SOLN
100.0000 mL | Freq: Once | INTRAMUSCULAR | Status: AC | PRN
  Administered 2013-07-20: 100 mL via INTRAVENOUS

## 2013-07-20 MED ORDER — SODIUM CHLORIDE 0.9 % IV BOLUS (SEPSIS)
1000.0000 mL | Freq: Once | INTRAVENOUS | Status: AC
  Administered 2013-07-20: 1000 mL via INTRAVENOUS

## 2013-07-20 MED ORDER — MORPHINE SULFATE 4 MG/ML IJ SOLN
4.0000 mg | Freq: Once | INTRAMUSCULAR | Status: AC
  Administered 2013-07-20: 4 mg via INTRAVENOUS
  Filled 2013-07-20: qty 1

## 2013-07-20 MED ORDER — IOHEXOL 300 MG/ML  SOLN
25.0000 mL | INTRAMUSCULAR | Status: DC | PRN
  Administered 2013-07-20: 25 mL via ORAL

## 2013-07-20 MED ORDER — TRAMADOL HCL 50 MG PO TABS: 50.0000 mg | ORAL_TABLET | Freq: Four times a day (QID) | ORAL | Status: DC | PRN

## 2013-07-20 NOTE — Discharge Instructions (Signed)
Abdominal Pain, Adult °Many things can cause abdominal pain. Usually, abdominal pain is not caused by a disease and will improve without treatment. It can often be observed and treated at home. Your health care provider will do a physical exam and possibly order blood tests and X-rays to help determine the seriousness of your pain. However, in many cases, more time must pass before a clear cause of the pain can be found. Before that point, your health care provider may not know if you need more testing or further treatment. °HOME CARE INSTRUCTIONS  °Monitor your abdominal pain for any changes. The following actions may help to alleviate any discomfort you are experiencing: °· Only take over-the-counter or prescription medicines as directed by your health care provider. °· Do not take laxatives unless directed to do so by your health care provider. °· Try a clear liquid diet (broth, tea, or water) as directed by your health care provider. Slowly move to a bland diet as tolerated. °SEEK MEDICAL CARE IF: °· You have unexplained abdominal pain. °· You have abdominal pain associated with nausea or diarrhea. °· You have pain when you urinate or have a bowel movement. °· You experience abdominal pain that wakes you in the night. °· You have abdominal pain that is worsened or improved by eating food. °· You have abdominal pain that is worsened with eating fatty foods. °SEEK IMMEDIATE MEDICAL CARE IF:  °· Your pain does not go away within 2 hours. °· You have a fever. °· You keep throwing up (vomiting). °· Your pain is felt only in portions of the abdomen, such as the right side or the left lower portion of the abdomen. °· You pass bloody or black tarry stools. °MAKE SURE YOU: °· Understand these instructions.   °· Will watch your condition.   °· Will get help right away if you are not doing well or get worse.   °Document Released: 12/28/2004 Document Revised: 01/08/2013 Document Reviewed: 11/27/2012 °[employer]® Patient  Information ©2014 [employer]. ° °

## 2013-07-20 NOTE — ED Notes (Signed)
Pt discharged to home with family. NAD.  

## 2013-07-20 NOTE — ED Notes (Signed)
The pt is c/o the wait.  He takes hi bp med x 3 and he only had enough med for one dose today

## 2013-07-20 NOTE — ED Provider Notes (Signed)
CSN: [id number]     Arrival date & time 07/19/13  2219 History   First MD Initiated Contact with Patient 07/20/13 [phone]     Chief Complaint  Patient presents with  . Abdominal Pain     (Consider location/radiation/quality/duration/timing/severity/associated sxs/prior Treatment) HPI Patient is a morbidly obese man with history of left inguinal hernia repair x 4. He presents with pain in the left inguinal pain.  Pain has been presents for several months. However, it became significantly worse 2 days ago. Pain is not relieved by position change. Nothing makes pain better. Worse with lifting. Patient had a normal BM yesterday. He denies nausea, vomiting and fever.   Past Medical History  Diagnosis Date  . Hypertension   . Obesity   . Sleep apnea    Past Surgical History  Procedure Laterality Date  . Hernia repair     No family history on file. History  Substance Use Topics  . Smoking status: Current Every Day Smoker  . Smokeless tobacco: Not on file  . Alcohol Use: No    Review of Systems Ten point review of symptoms performed and is negative with the exception of symptoms noted above.     Allergies  Review of patient's allergies indicates no known allergies.  Home Medications   Prior to Admission medications   Medication Sig Start Date End Date Taking? Authorizing Provider  diltiazem (CARDIZEM) 60 MG tablet Take 60 mg by mouth 3 (three) times daily.   Yes Historical Provider, MD   BP 144/107  Pulse 90  Temp(Src) 97.9 F (36.6 C) (Oral)  Resp 20  Ht 5\' 10"  (1.778 m)  Wt 330 lb (149.687 kg)  BMI 47.35 kg/m2  SpO2 95% Physical Exam Gen: well developed and well nourished appearing Head: NCAT Eyes: PERL, EOMI Nose: no epistaixis or rhinorrhea Mouth/throat: mucosa is moist and pink Neck: supple, no stridor Lungs: CTA B, no wheezing, rhonchi or rales CV: regular rate and rythm, good distal pulses.  Abd: soft, morbidly obese, ttp over the left inguinal region but I  am unable to palpate a discrete hernia - exam is limited by body habitus. Normal BS.  Back: no ttp, no cva ttp Skin: warm and dry Ext: no edema, normal to inspection Neuro: CN ii-xii grossly intact, no focal deficits Psyche; normal affect,  calm and cooperative.  ED Course  Procedures (including critical care time) Labs Review  Results for orders placed during the hospital encounter of 07/20/13 (from the past 24 hour(s))  CBC WITH DIFFERENTIAL     Status: Abnormal   Collection Time    07/19/13 10:34 PM      Result Value Ref Range   WBC 10.8 (*) 4.0 - 10.5 K/uL   RBC 4.63  4.22 - 5.81 MIL/uL   Hemoglobin 14.7  13.0 - 17.0 g/dL   HCT 11.9  14.7 - 82.9 %   MCV 88.8  78.0 - 100.0 fL   MCH 31.7  26.0 - 34.0 pg   MCHC 35.8  30.0 - 36.0 g/dL   RDW 56.2  13.0 - 86.5 %   Platelets 192  150 - 400 K/uL   Neutrophils Relative % 68  43 - 77 %   Neutro Abs 7.3  1.7 - 7.7 K/uL   Lymphocytes Relative 23  12 - 46 %   Lymphs Abs 2.5  0.7 - 4.0 K/uL   Monocytes Relative 8  3 - 12 %   Monocytes Absolute 0.9  0.1 - 1.0 K/uL  Eosinophils Relative 1  0 - 5 %   Eosinophils Absolute 0.2  0.0 - 0.7 K/uL   Basophils Relative 0  0 - 1 %   Basophils Absolute 0.0  0.0 - 0.1 K/uL  COMPREHENSIVE METABOLIC PANEL     Status: Abnormal   Collection Time    07/19/13 10:34 PM      Result Value Ref Range   Sodium 138  137 - 147 mEq/L   Potassium 4.5  3.7 - 5.3 mEq/L   Chloride 101  96 - 112 mEq/L   CO2 24  19 - 32 mEq/L   Glucose, Bld 127 (*) 70 - 99 mg/dL   BUN 13  6 - 23 mg/dL   Creatinine, Ser 1.611.36 (*) 0.50 - 1.35 mg/dL   Calcium 9.4  8.4 - 09.610.5 mg/dL   Total Protein 7.4  6.0 - 8.3 g/dL   Albumin 3.7  3.5 - 5.2 g/dL   AST 31  0 - 37 U/L   ALT 29  0 - 53 U/L   Alkaline Phosphatase 65  39 - 117 U/L   Total Bilirubin 0.4  0.3 - 1.2 mg/dL   GFR calc non Af Amer 64 (*) >90 mL/min   GFR calc Af Amer 74 (*) >90 mL/min  URINALYSIS, ROUTINE W REFLEX MICROSCOPIC     Status: Abnormal   Collection Time     07/20/13 12:59 AM      Result Value Ref Range   Color, Urine YELLOW  YELLOW   APPearance CLEAR  CLEAR   Specific Gravity, Urine 1.029  1.005 - 1.030   pH 5.5  5.0 - 8.0   Glucose, UA NEGATIVE  NEGATIVE mg/dL   Hgb urine dipstick NEGATIVE  NEGATIVE   Bilirubin Urine NEGATIVE  NEGATIVE   Ketones, ur 15 (*) NEGATIVE mg/dL   Protein, ur 045100 (*) NEGATIVE mg/dL   Urobilinogen, UA 1.0  0.0 - 1.0 mg/dL   Nitrite NEGATIVE  NEGATIVE   Leukocytes, UA NEGATIVE  NEGATIVE  URINE MICROSCOPIC-ADD ON     Status: Abnormal   Collection Time    07/20/13 12:59 AM      Result Value Ref Range   Squamous Epithelial / LPF FEW (*) RARE   WBC, UA 0-2  <3 WBC/hpf   RBC / HPF 0-2  <3 RBC/hpf   Bacteria, UA FEW (*) RARE   Urine-Other MUCOUS PRESENT    LACTIC ACID, PLASMA     Status: None   Collection Time    07/20/13  2:25 AM      Result Value Ref Range   Lactic Acid, Venous 1.3  0.5 - 2.2 mmol/L    CT Abdomen Pelvis W Contrast (Final result)  Result time: 07/20/13 06:30:53    Final result by Rad Results In Interface (07/20/13 06:30:53)    Narrative:   CLINICAL DATA: LLQ pain, hx of incarcerated hernia  EXAM: CT ABDOMEN AND PELVIS WITH CONTRAST  TECHNIQUE: Multidetector CT imaging of the abdomen and pelvis was performed using the standard protocol following bolus administration of intravenous contrast.  CONTRAST: 100mL OMNIPAQUE IOHEXOL 300 MG/ML SOLN  COMPARISON: None.  FINDINGS: Subsegmental atelectasis seen dependently within the visualized lower lobes.  The liver demonstrates a normal contrast enhanced appearance. The gallbladder is within normal limits. No biliary ductal dilatation. The spleen, adrenal glands, and pancreas demonstrate a normal contrast enhanced appearance.  The kidneys are equal in size with symmetric enhancement. No nephrolithiasis, hydronephrosis, or focal enhancing renal mass.  No evidence of  bowel obstruction. Appendix is normal. Inflammatory fat stranding  seen within the anterior left hemipelvis (series 2, image 67). There is linear round soft tissue density within the left inguinal region immediately inferior to this area of inflammatory fat stranding extending towards the left inguinal canal. While these findings may in part reflect postsurgical changes, the presence of inflammatory fat stranding and suggest acute inflammation. The sigmoid colon is closely approximates these findings, but is without abnormal enhancement or wall thickening. No herniated loops of bowel identified.  Bladder and prostate are within normal limits.  No free air or fluid. Normal intravascular enhancement seen within the abdomen and pelvis. No pathologically enlarged intra-abdominal pelvic lymph nodes.  No acute osseous abnormality.  IMPRESSION: 1. Probable postsurgical changes within the left inguinal region extending towards the left inguinal canal, likely related to prior left inguinal hernia repair. There is fat stranding located within this region and about the adjacent sigmoid colon, compatible with acute inflammation. No herniated loops of bowel or evidence of obstruction identified. 2. No other acute intra-abdominal pelvic process.   Electronically Signed By: Rise MuBenjamin McClintock M.D. On: 07/20/2013 06:30      MDM    DDX: colitis, incarcerated hernia, UTI, abdominal wall pain.   CT scan notes inflammatory changes in the left inguinal region without evidence of incarcerated bowel loop. I have requested a consult to General Surgery to help with interpretation of this CT report.   0730: Dr. Lindie SpruceWyatt has reviewed the patient's CT Scan and believes that he probably has some localized mesenteric adenitis. But, no acute surgical problem and no inguinal hernia. The patient is stable for discharge with plan for symptomatic management, return precautions and outpatient f/u.   Brandt LoosenJulie Mahlani Berninger, MD 07/20/13 [phone]

## 2014-05-08 ENCOUNTER — Emergency Department (HOSPITAL_COMMUNITY)
Admission: EM | Admit: 2014-05-08 | Discharge: 2014-05-08 | Disposition: A | Discharge status: Home / Self Care | Payer: Medicaid Other | Attending: Emergency Medicine | Admitting: Emergency Medicine

## 2014-05-08 ENCOUNTER — Emergency Department (HOSPITAL_COMMUNITY): Payer: Medicaid Other

## 2014-05-08 ENCOUNTER — Encounter (HOSPITAL_COMMUNITY): Payer: Self-pay | Admitting: Family Medicine

## 2014-05-08 DIAGNOSIS — Z8669 Personal history of other diseases of the nervous system and sense organs: Secondary | ICD-10-CM | POA: Insufficient documentation

## 2014-05-08 DIAGNOSIS — R079 Chest pain, unspecified: Secondary | ICD-10-CM

## 2014-05-08 DIAGNOSIS — R0789 Other chest pain: Secondary | ICD-10-CM | POA: Insufficient documentation

## 2014-05-08 DIAGNOSIS — I10 Essential (primary) hypertension: Secondary | ICD-10-CM | POA: Insufficient documentation

## 2014-05-08 DIAGNOSIS — Z720 Tobacco use: Secondary | ICD-10-CM | POA: Insufficient documentation

## 2014-05-08 DIAGNOSIS — E669 Obesity, unspecified: Secondary | ICD-10-CM | POA: Insufficient documentation

## 2014-05-08 DIAGNOSIS — Z79899 Other long term (current) drug therapy: Secondary | ICD-10-CM | POA: Insufficient documentation

## 2014-05-08 DIAGNOSIS — R200 Anesthesia of skin: Secondary | ICD-10-CM | POA: Insufficient documentation

## 2014-05-08 LAB — BASIC METABOLIC PANEL
ANION GAP: 7 (ref 5–15)
BUN: 15 mg/dL (ref 6–23)
CHLORIDE: 105 mmol/L (ref 96–112)
CO2: 24 mmol/L (ref 19–32)
Calcium: 9.6 mg/dL (ref 8.4–10.5)
Creatinine, Ser: 1.26 mg/dL (ref 0.50–1.35)
GFR calc Af Amer: 81 mL/min — ABNORMAL LOW (ref >90)
GFR, EST NON AFRICAN AMERICAN: 70 mL/min — AB (ref >90)
Glucose, Bld: 105 mg/dL — ABNORMAL HIGH (ref 70–99)
POTASSIUM: 3.8 mmol/L (ref 3.5–5.1)
Sodium: 136 mmol/L (ref 135–145)

## 2014-05-08 LAB — I-STAT TROPONIN, ED: Troponin i, poc: 0 ng/mL (ref 0.00–0.08)

## 2014-05-08 LAB — CBC
HEMATOCRIT: 39.1 % (ref 39.0–52.0)
HEMOGLOBIN: 13.7 g/dL (ref 13.0–17.0)
MCH: 30.5 pg (ref 26.0–34.0)
MCHC: 35 g/dL (ref 30.0–36.0)
MCV: 87.1 fL (ref 78.0–100.0)
PLATELETS: 221 10*3/uL (ref 150–400)
RBC: 4.49 MIL/uL (ref 4.22–5.81)
RDW: 13.1 % (ref 11.5–15.5)
WBC: 9.1 10*3/uL (ref 4.0–10.5)

## 2014-05-08 LAB — BRAIN NATRIURETIC PEPTIDE: B Natriuretic Peptide: 16.4 pg/mL (ref 0.0–100.0)

## 2014-05-08 NOTE — ED Provider Notes (Addendum)
CSN: [id number]     Arrival date & time 05/08/14  1726 History   First MD Initiated Contact with Patient 05/08/14 1948     Chief Complaint  Patient presents with  . Chest Pain  . Arm Pain  . Numbness     (Consider location/radiation/quality/duration/timing/severity/associated sxs/prior Treatment) Patient is a 39 y.o. male presenting with chest pain and arm pain. The history is provided by the patient.  Chest Pain Pain location:  L chest Pain quality: tightness   Pain radiates to:  L shoulder and neck Pain radiates to the back: no   Pain severity:  Moderate Onset quality:  Gradual Duration:  1 month Timing:  Intermittent Progression:  Worsening Chronicity:  Recurrent Context: stress   Context comment:  Seems to occur at any time. However he has been more stressed recently with school and financial issues Relieved by:  None tried Exacerbated by: Taking deep breaths and lying on the left side. Ineffective treatments:  None tried Associated symptoms: palpitations   Associated symptoms: no abdominal pain, no anorexia, no back pain, no cough, no fever, no headache, no heartburn, no lower extremity edema, no nausea, no shortness of breath, not vomiting and no weakness   Associated symptoms comment:  Occasionally he will feel very stressed and feel palpitations and have to go into a room and sit quietly for them to resolve Risk factors: hypertension, male sex and smoking   Risk factors: no coronary artery disease, no diabetes mellitus, no immobilization, no prior DVT/PE and no surgery   Risk factors comment:  No family history of cardiac disease Arm Pain Associated symptoms include chest pain. Pertinent negatives include no abdominal pain, no headaches and no shortness of breath.    Past Medical History  Diagnosis Date  . Hypertension   . Obesity   . Sleep apnea    Past Surgical History  Procedure Laterality Date  . Hernia repair     History reviewed. No pertinent family  history. History  Substance Use Topics  . Smoking status: Current Every Day Smoker  . Smokeless tobacco: Not on file  . Alcohol Use: No    Review of Systems  Constitutional: Negative for fever.  Respiratory: Negative for cough and shortness of breath.   Cardiovascular: Positive for chest pain and palpitations.  Gastrointestinal: Negative for heartburn, nausea, vomiting, abdominal pain and anorexia.  Musculoskeletal: Negative for back pain.  Neurological: Negative for weakness and headaches.  All other systems reviewed and are negative.     Allergies  Review of patient's allergies indicates no known allergies.  Home Medications   Prior to Admission medications   Medication Sig Start Date End Date Taking? Authorizing Provider  hydrochlorothiazide (HYDRODIURIL) 25 MG tablet Take 25 mg by mouth daily.   Yes Historical Provider, MD  lisinopril (PRINIVIL,ZESTRIL) 40 MG tablet Take 40 mg by mouth daily.   Yes Historical Provider, MD  traMADol (ULTRAM) 50 MG tablet Take 1 tablet (50 mg total) by mouth every 6 (six) hours as needed. Patient not taking: Reported on 05/08/2014 07/20/13   Brandt Loosen, MD   BP 110/57 mmHg  Pulse 83  Temp(Src) 98.3 F (36.8 C) (Oral)  Resp 20  SpO2 100% Physical Exam  Constitutional: He is oriented to person, place, and time. He appears well-developed and well-nourished. No distress.  Obese male  HENT:  Head: Normocephalic and atraumatic.  Mouth/Throat: Oropharynx is clear and moist.  Eyes: Conjunctivae and EOM are normal. Pupils are equal, round, and reactive to light.  Neck: Normal range of motion. Neck supple.  Cardiovascular: Normal rate, regular rhythm and intact distal pulses.   No murmur heard. Pulses equal bilaterally  Pulmonary/Chest: Effort normal and breath sounds normal. No respiratory distress. He has no wheezes. He has no rales. He exhibits tenderness. He exhibits no bony tenderness, no crepitus, no deformity and no swelling.     Abdominal: Soft. He exhibits no distension. There is no tenderness. There is no rebound and no guarding.  Musculoskeletal: Normal range of motion. He exhibits no edema or tenderness.  Neurological: He is alert and oriented to person, place, and time.  Skin: Skin is warm and dry. No rash noted. No erythema.  Psychiatric: He has a normal mood and affect. His behavior is normal.  Nursing note and vitals reviewed.   ED Course  Procedures (including critical care time) Labs Review Labs Reviewed  BASIC METABOLIC PANEL - Abnormal; Notable for the following:    Glucose, Bld 105 (*)    GFR calc non Af Amer 70 (*)    GFR calc Af Amer 81 (*)    All other components within normal limits  CBC  BRAIN NATRIURETIC PEPTIDE  I-STAT TROPOININ, ED    Imaging Review Dg Chest 2 View  05/08/2014   CLINICAL DATA:  Left-sided chest pain for 1 week.  EXAM: CHEST  2 VIEW  COMPARISON:  06/25/2012.  FINDINGS: The cardiac silhouette, mediastinal and hilar contours are within normal limits and stable. There are chronic bronchitic changes, likely related to smoking. No infiltrates or effusions. The bony thorax is intact.  IMPRESSION: Chronic bronchitic changes, likely related to smoking. No infiltrates or effusions.   Electronically Signed   By: Loralie ChampagneMark  Gallerani M.D.   On: 05/08/2014 18:38     EKG Interpretation   Date/Time:  Friday May 08 2014 17:46:39 EST Ventricular Rate:  94 PR Interval:  174 QRS Duration: 84 QT Interval:  342 QTC Calculation: 427 R Axis:   34 Text Interpretation:  Normal sinus rhythm Nonspecific T wave abnormality  No significant change since last tracing Confirmed by Anitra LauthPLUNKETT  MD,  Alphonzo LemmingsWHITNEY ([id number]) on 05/08/2014 7:37:37 PM      MDM   Final diagnoses:  Atypical chest pain    Pt with atypical story for CP that has been intermittent for over 1 month and worse with stress and lying on his left side.  Only risk factors are obese, HTN and smoking.  No family hx or prior heart  hx.  Heart score of 2 for risk ractors .  PERC neg.  EKG unchanged.  CXR, CBC, BMP, trop are all neg.  discussed with patient the importance of stop smoking. Also [facility].  Low suspicion for dissection at this time.  Given patient has a low heart score and atypical story. That he is a good candidate for outpatient follow-up. Findings and x-ray results reviewed with the patient and his wife.     Gwyneth SproutWhitney Estaban [place], MD 05/08/14 2030  Gwyneth SproutWhitney Sherif Millspaugh, MD 05/08/14 2033

## 2014-05-08 NOTE — ED Notes (Signed)
Per pt sts months of chest pain, left arm pain and numbness. sts left side feels funny. sts worse over the weekend when he was running on the treadmill. sts some SOB.

## 2014-05-08 NOTE — Discharge Instructions (Signed)
Chest Pain (Nonspecific) °It is often hard to give a specific diagnosis for the cause of chest pain. There is always a chance that your pain could be related to something serious, such as a heart attack or a blood clot in the lungs. You need to follow up with your health care provider for further evaluation. °CAUSES  °· Heartburn. °· Pneumonia or bronchitis. °· Anxiety or stress. °· Inflammation around your heart (pericarditis) or lung (pleuritis or pleurisy). °· A blood clot in the lung. °· A collapsed lung (pneumothorax). It can develop suddenly on its own (spontaneous pneumothorax) or from trauma to the chest. °· Shingles infection (herpes zoster virus). °The chest wall is composed of bones, muscles, and cartilage. Any of these can be the source of the pain. °· The bones can be bruised by injury. °· The muscles or cartilage can be strained by coughing or overwork. °· The cartilage can be affected by inflammation and become sore (costochondritis). °DIAGNOSIS  °Lab tests or other studies may be needed to find the cause of your pain. Your health care provider may have you take a test called an ambulatory electrocardiogram (ECG). An ECG records your heartbeat patterns over a 24-hour period. You may also have other tests, such as: °· Transthoracic echocardiogram (TTE). During echocardiography, sound waves are used to evaluate how blood flows through your heart. °· Transesophageal echocardiogram (TEE). °· Cardiac monitoring. This allows your health care provider to monitor your heart rate and rhythm in real time. °· Holter monitor. This is a portable device that records your heartbeat and can help diagnose heart arrhythmias. It allows your health care provider to track your heart activity for several days, if needed. °· Stress tests by exercise or by giving medicine that makes the heart beat faster. °TREATMENT  °· Treatment depends on what may be causing your chest pain. Treatment may include: °· Acid blockers for  heartburn. °· Anti-inflammatory medicine. °· Pain medicine for inflammatory conditions. °· Antibiotics if an infection is present. °· You may be advised to change lifestyle habits. This includes stopping smoking and avoiding alcohol, caffeine, and chocolate. °· You may be advised to keep your head raised (elevated) when sleeping. This reduces the chance of acid going backward from your stomach into your esophagus. °Most of the time, nonspecific chest pain will improve within 2-3 days with rest and mild pain medicine.  °HOME CARE INSTRUCTIONS  °· If antibiotics were prescribed, take them as directed. Finish them even if you start to feel better. °· For the next few days, avoid physical activities that bring on chest pain. Continue physical activities as directed. °· Do not use any tobacco products, including cigarettes, chewing tobacco, or electronic cigarettes. °· Avoid drinking alcohol. °· Only take medicine as directed by your health care provider. °· Follow your health care provider's suggestions for further testing if your chest pain does not go away. °· Keep any follow-up appointments you made. If you do not go to an appointment, you could develop lasting (chronic) problems with pain. If there is any problem keeping an appointment, call to reschedule. °SEEK MEDICAL CARE IF:  °· Your chest pain does not go away, even after treatment. °· You have a rash with blisters on your chest. °· You have a fever. °SEEK IMMEDIATE MEDICAL CARE IF:  °· You have increased chest pain or pain that spreads to your arm, neck, jaw, back, or abdomen. °· You have shortness of breath. °· You have an increasing cough, or you cough   up blood. °· You have severe back or abdominal pain. °· You feel nauseous or vomit. °· You have severe weakness. °· You faint. °· You have chills. °This is an emergency. Do not wait to see if the pain will go away. Get medical help at once. Call your local emergency services (911 in [place] Do not drive  yourself to the hospital. °MAKE SURE YOU:  °· Understand these instructions. °· Will watch your condition. °· Will get help right away if you are not doing well or get worse. °Document Released: 12/28/2004 Document Revised: 03/25/2013 Document Reviewed: 10/24/2007 °ExitCare® Patient Information ©2015 [employer]. This information is not intended to replace advice given to you by your health care provider. Make sure you discuss any questions you have with your health care provider. ° °Chest Wall Pain °Chest wall pain is pain in or around the bones and muscles of your chest. It may take up to 6 weeks to get better. It may take longer if you must stay physically active in your work and activities.  °CAUSES  °Chest wall pain may happen on its own. However, it may be caused by: °· A viral illness like the flu. °· Injury. °· Coughing. °· Exercise. °· Arthritis. °· Fibromyalgia. °· Shingles. °HOME CARE INSTRUCTIONS  °· Avoid overtiring physical activity. Try not to strain or perform activities that cause pain. This includes any activities using your chest or your abdominal and side muscles, especially if heavy weights are used. °· Put ice on the sore area. °¨ Put ice in a plastic bag. °¨ Place a towel between your skin and the bag. °¨ Leave the ice on for 15-20 minutes per hour while awake for the first 2 days. °· Only take over-the-counter or prescription medicines for pain, discomfort, or fever as directed by your caregiver. °SEEK IMMEDIATE MEDICAL CARE IF:  °· Your pain increases, or you are very uncomfortable. °· You have a fever. °· Your chest pain becomes worse. °· You have new, unexplained symptoms. °· You have nausea or vomiting. °· You feel sweaty or lightheaded. °· You have a cough with phlegm (sputum), or you cough up blood. °MAKE SURE YOU:  °· Understand these instructions. °· Will watch your condition. °· Will get help right away if you are not doing well or get worse. °Document Released: 03/20/2005 Document  Revised: 06/12/2011 Document Reviewed: 11/14/2010 °ExitCare® Patient Information ©2015 [employer]. This information is not intended to replace advice given to you by your health care provider. Make sure you discuss any questions you have with your health care provider. ° °

## 2015-02-08 ENCOUNTER — Emergency Department (HOSPITAL_COMMUNITY)
Admission: EM | Admit: 2015-02-08 | Discharge: 2015-02-08 | Disposition: A | Discharge status: Home / Self Care | Payer: Managed Care, Other (non HMO) | Source: Home / Self Care | Attending: Family Medicine | Admitting: Family Medicine

## 2015-02-08 ENCOUNTER — Encounter (HOSPITAL_COMMUNITY): Payer: Self-pay | Admitting: *Deleted

## 2015-02-08 DIAGNOSIS — N410 Acute prostatitis: Secondary | ICD-10-CM | POA: Diagnosis not present

## 2015-02-08 LAB — POCT URINALYSIS DIP (DEVICE)
Bilirubin Urine: NEGATIVE
Glucose, UA: NEGATIVE mg/dL
Ketones, ur: NEGATIVE mg/dL
LEUKOCYTES UA: NEGATIVE
Nitrite: NEGATIVE
Protein, ur: 30 mg/dL — AB
SPECIFIC GRAVITY, URINE: 1.025 (ref 1.005–1.030)
Urobilinogen, UA: 0.2 mg/dL (ref 0.0–1.0)
pH: 5 (ref 5.0–8.0)

## 2015-02-08 MED ORDER — CIPROFLOXACIN HCL 500 MG PO TABS: 500.0000 mg | ORAL_TABLET | Freq: Two times a day (BID) | ORAL | Status: DC

## 2015-02-08 NOTE — ED Provider Notes (Addendum)
CSN: [id number]     Arrival date & time 02/08/15  1311 History   First MD Initiated Contact with Patient 02/08/15 1415     Chief Complaint  Patient presents with  . Urinary Urgency   (Consider location/radiation/quality/duration/timing/severity/associated sxs/prior Treatment) Patient is a 39 y.o. male presenting with frequency. The history is provided by the patient.  Urinary Frequency This is a new problem. The current episode started more than 2 days ago. The problem has been gradually worsening. Associated symptoms include abdominal pain. Pertinent negatives include no chest pain and no shortness of breath.    Past Medical History  Diagnosis Date  . Hypertension   . Obesity   . Sleep apnea    Past Surgical History  Procedure Laterality Date  . Hernia repair     History reviewed. No pertinent family history. Social History  Substance Use Topics  . Smoking status: Current Every Day Smoker  . Smokeless tobacco: None  . Alcohol Use: No    Review of Systems  Constitutional: Negative for fever.  Respiratory: Negative for shortness of breath.   Cardiovascular: Negative.  Negative for chest pain.  Gastrointestinal: Positive for abdominal pain.  Genitourinary: Positive for dysuria, frequency and flank pain. Negative for discharge, penile swelling and penile pain.  Musculoskeletal: Negative.   All other systems reviewed and are negative.   Allergies  Review of patient's allergies indicates no known allergies.  Home Medications   Prior to Admission medications   Medication Sig Start Date End Date Taking? Authorizing Provider  ciprofloxacin (CIPRO) 500 MG tablet Take 1 tablet (500 mg total) by mouth 2 (two) times daily. 02/08/15   Linna HoffJames D Manasvini Whatley, MD  hydrochlorothiazide (HYDRODIURIL) 25 MG tablet Take 25 mg by mouth daily.    Historical Provider, MD  lisinopril (PRINIVIL,ZESTRIL) 40 MG tablet Take 40 mg by mouth daily.    Historical Provider, MD  traMADol (ULTRAM) 50 MG tablet  Take 1 tablet (50 mg total) by mouth every 6 (six) hours as needed. Patient not taking: Reported on 05/08/2014 07/20/13   Brandt LoosenJulie Manly, MD   Meds Ordered and Administered this Visit  Medications - No data to display  BP 127/89 mmHg  Pulse 96  Temp(Src) 98.4 F (36.9 C) (Oral)  Resp 16  SpO2 95% No data found.   Physical Exam  Constitutional: He is oriented to person, place, and time. He appears well-developed and well-nourished. No distress.  Abdominal: Soft. Bowel sounds are normal. He exhibits no distension and no mass. There is no tenderness. There is no rebound and no guarding.  Genitourinary: Penis normal.  Neurological: He is alert and oriented to person, place, and time.  Skin: Skin is warm and dry.  Nursing note and vitals reviewed.   ED Course  Procedures (including critical care time)  Labs Review Labs Reviewed  POCT URINALYSIS DIP (DEVICE) - Abnormal; Notable for the following:    Hgb urine dipstick TRACE (*)    Protein, ur 30 (*)    All other components within normal limits   U/a neg Imaging Review No results found.   Visual Acuity Review  Right Eye Distance:   Left Eye Distance:   Bilateral Distance:    Right Eye Near:   Left Eye Near:    Bilateral Near:         MDM   1. Prostatitis, acute    rx for cipro for prostatitis.    Linna HoffJames D Lachrisha Ziebarth, MD 02/08/15 1438  Linna HoffJames D Geralynn Capri, MD 02/12/15 2040

## 2015-02-08 NOTE — ED Notes (Signed)
Pt  Reports   Symptoms  Of      Urinary      Discomfort   -       Pt   Reports     Sensation   Of         Low  abd  Cramping   At times              Pt states He  Has  A  uti  In  The  Past         And  Feels  Like  The  Symptoms      Are  Similar           He  denys  A  discahrge  Or  Any  Sores  On  His  Penis

## 2015-04-27 ENCOUNTER — Other Ambulatory Visit (HOSPITAL_COMMUNITY): Payer: Self-pay | Admitting: General Surgery

## 2015-06-03 ENCOUNTER — Other Ambulatory Visit (HOSPITAL_COMMUNITY): Payer: Managed Care, Other (non HMO)

## 2015-06-03 ENCOUNTER — Ambulatory Visit (HOSPITAL_COMMUNITY): Admission: RE | Admit: 2015-06-03 | Payer: Managed Care, Other (non HMO) | Source: Ambulatory Visit

## 2015-06-24 ENCOUNTER — Ambulatory Visit: Payer: Managed Care, Other (non HMO) | Admitting: Dietician

## 2015-06-25 ENCOUNTER — Other Ambulatory Visit: Payer: Self-pay

## 2015-06-25 ENCOUNTER — Ambulatory Visit (HOSPITAL_COMMUNITY)
Admission: RE | Admit: 2015-06-25 | Discharge: 2015-06-25 | Disposition: A | Discharge status: Home / Self Care | Payer: Managed Care, Other (non HMO) | Source: Ambulatory Visit | Attending: General Surgery | Admitting: General Surgery

## 2015-06-25 DIAGNOSIS — I10 Essential (primary) hypertension: Secondary | ICD-10-CM | POA: Insufficient documentation

## 2015-06-25 DIAGNOSIS — F17200 Nicotine dependence, unspecified, uncomplicated: Secondary | ICD-10-CM | POA: Diagnosis not present

## 2015-07-07 ENCOUNTER — Encounter: Payer: Managed Care, Other (non HMO) | Attending: General Surgery | Admitting: Skilled Nursing Facility1

## 2015-07-07 ENCOUNTER — Encounter: Payer: Self-pay | Admitting: Skilled Nursing Facility1

## 2015-07-07 DIAGNOSIS — Z6841 Body Mass Index (BMI) 40.0 and over, adult: Secondary | ICD-10-CM | POA: Insufficient documentation

## 2015-07-07 NOTE — Progress Notes (Signed)
  Pre-Op Assessment Visit:  Pre-Operative Sleeve Gastrectomy Surgery  Medical Nutrition Therapy:  Appt start time: 1600   End time:  1700.  Patient was seen on 07/07/2015 for Pre-Operative Nutrition Assessment. Assessment and [facility] coordinator on 07/07/2015.   Pt states Since moving from [place] in 2012 he has gained 100+ pounds.  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 Pt states he Likes to play dance central with his daughter. Pt conversed with the dietitian about maybe not needing surgery if he loses the excess wt due to his SWL appointments.  Pts wt: 329 BMI: 47.3  Preferred Learning Style:   Auditory  Visual  Learning Readiness:   Ready Handouts given during visit include:  Pre-Op Goals Bariatric Surgery Protein Shakes   During the appointment today the following Pre-Op Goals were reviewed with the patient: Maintain or lose weight as instructed by your surgeon Make healthy food choices Begin to limit portion sizes Limited concentrated sugars and fried foods Keep fat/sugar in the single digits per serving on   food labels Practice CHEWING your food  (aim for 30 chews per bite or until applesauce consistency) Practice not drinking 15 minutes before, during, and 30 minutes after each meal/snack Avoid all carbonated beverages  Avoid/limit caffeinated beverages  Avoid all sugar-sweetened beverages Consume 3 meals per day; eat every 3-5 hours Make a list of non-food related activities Aim for 64-100 ounces of FLUID daily  Aim for at least 60-80 grams of PROTEIN daily Look for a liquid protein source that contain ?15 g protein and ?5 g carbohydrate  (ex: shakes, drinks, shots)  Demonstrated degree of understanding via:  Teach Back  Teaching Method Utilized:  Visual Auditory Hands on  Barriers to learning/adherence to lifestyle change: none  stated  Patient to call the [facility] to enroll in Pre-Op and Post-Op Nutrition Education when surgery date is scheduled.

## 2015-07-07 NOTE — Patient Instructions (Signed)
Follow Pre-Op Goals Try Protein Shakes Call [facility] at [phone] when surgery is scheduled to enroll in Pre-Op Class  Things to remember:  Please always be honest with us. We want to support you!  If you have any questions or concerns in between appointments, please call or email Jennette BankerLiz, Leslie, or Jacki ConesLaurie.  The diet after surgery will be high protein and low in carbohydrate.  Vitamins and calcium need to be taken for the rest of your life.  Feel free to include support people in any classes or appointments.  Incorporate more lean meat and nonstarchy (examples: fish, chicken, Malaw[place]urkey, deer, etc.; asparagus, broccoli, green beans, etc.)  Only whole wheat bread: whole wheat flour is the first ingredient   Supplement recommendations:  Complete" Multivitamin: Sleeve Gastrectomy and RYGB patients take a double dose of MVI. LAGB patients take single dose as it is written on the package. Vitamin must be liquid or chewable but not gummy. Examples of these include Flintstones Complete and Centrum Complete. If the vitamin is bariatric-specific, take 1 dose as it is already formulated for bariatric surgery patients. Examples of these are Bariatric Advantage, Celebrate, and Tyson FoodsWellesse. These can be found at the [facility] and/or online.     Calcium citrate: 1500 mg/day of Calcium citrate (also chewable or liquid) is recommended for all procedures. The body is only able to absorb 500-600 mg of Calcium at one time so 3 daily doses of 500 mg are recommended. Calcium doses must be taken a minimum of 2 hours apart. Additionally, Calcium must be taken 2 hours apart from iron-containing MVI. Examples of brands include Celebrate, Bariatric Advantage, and Wellesse. These brands must be purchased online or at the [facility]. Citracal Petites is the only Calcium citrate supplement found in general grocery stores and pharmacies. This is in tablet form and may be recommended for  patients who do not tolerate chewable Calcium.  Continued or added Vitamin D supplementation based on individual needs.    Vitamin B12: 300-500 mcg/day for Sleeve Gastrectomy and RYGB. Optional for LAGB patients as stomach remains fully intact. Must be taken intramuscularly, sublingually, or inhaled nasally. Oral route is not recommended.

## 2015-08-05 ENCOUNTER — Ambulatory Visit: Payer: Managed Care, Other (non HMO) | Admitting: Dietician

## 2015-09-06 ENCOUNTER — Ambulatory Visit: Payer: Managed Care, Other (non HMO) | Admitting: Dietician

## 2015-09-16 ENCOUNTER — Encounter (HOSPITAL_COMMUNITY): Payer: Self-pay | Admitting: Neurology

## 2015-09-16 ENCOUNTER — Emergency Department (HOSPITAL_COMMUNITY): Payer: Managed Care, Other (non HMO)

## 2015-09-16 ENCOUNTER — Emergency Department (HOSPITAL_COMMUNITY)
Admission: EM | Admit: 2015-09-16 | Discharge: 2015-09-16 | Disposition: A | Discharge status: Home / Self Care | Payer: Managed Care, Other (non HMO) | Attending: Emergency Medicine | Admitting: Emergency Medicine

## 2015-09-16 DIAGNOSIS — Z79899 Other long term (current) drug therapy: Secondary | ICD-10-CM | POA: Insufficient documentation

## 2015-09-16 DIAGNOSIS — M25512 Pain in left shoulder: Secondary | ICD-10-CM

## 2015-09-16 DIAGNOSIS — F17200 Nicotine dependence, unspecified, uncomplicated: Secondary | ICD-10-CM | POA: Insufficient documentation

## 2015-09-16 DIAGNOSIS — I10 Essential (primary) hypertension: Secondary | ICD-10-CM | POA: Diagnosis not present

## 2015-09-16 MED ORDER — KETOROLAC TROMETHAMINE 30 MG/ML IJ SOLN
30.0000 mg | Freq: Once | INTRAMUSCULAR | Status: AC
  Administered 2015-09-16: 30 mg via INTRAMUSCULAR
  Filled 2015-09-16: qty 1

## 2015-09-16 MED ORDER — TRAMADOL HCL 50 MG PO TABS: 50.0000 mg | ORAL_TABLET | Freq: Four times a day (QID) | ORAL | Status: DC | PRN

## 2015-09-16 MED ORDER — NAPROXEN 500 MG PO TABS: 500.0000 mg | ORAL_TABLET | Freq: Two times a day (BID) | ORAL | Status: DC

## 2015-09-16 NOTE — ED Notes (Signed)
Pt reports left shoulder pain since last night. Feels like pain is in his joint and shooting down his whole arm. Denies injury. Pain is worse when he reaches behind him.

## 2015-09-16 NOTE — ED Provider Notes (Signed)
CSN: [id number]     Arrival date & time 09/16/15  1218 History  By signing my name below, I, Henry Morrow, attest that this documentation has been prepared under the direction and in the presence of Henry Glad, PA-C Electronically Signed: Charline Morrow, ED Scribe 09/16/2015 at 2:05 PM.   Chief Complaint  Patient presents with  . Shoulder Pain   The history is provided by the patient. No language interpreter was used.   HPI Comments: Henry Morrow is a 40 y.o. male who presents to the Emergency Department complaining of constant, moderate left shoulder pain onset last night. Pt denies injury or heavy lifting. He reports a burning sensation that radiates down his left arm with movement and associated intermittent tingling in his left hand. Pain worse with movement.  No treatments tried PTA. Pt denies chest pain and SOB.   Past Medical History  Diagnosis Date  . Hypertension   . Obesity   . Sleep apnea    Past Surgical History  Procedure Laterality Date  . Hernia repair     No family history on file. Social History  Substance Use Topics  . Smoking status: Current Every Day Smoker  . Smokeless tobacco: None  . Alcohol Use: No    Review of Systems  Respiratory: Negative for shortness of breath.   Cardiovascular: Negative for chest pain.  Musculoskeletal: Positive for arthralgias.  All other systems reviewed and are negative.  Allergies  Review of patient's allergies indicates no known allergies.  Home Medications   Prior to Admission medications   Medication Sig Start Date End Date Taking? Authorizing Provider  amLODipine (NORVASC) 5 MG tablet Take 5 mg by mouth daily.    Historical Provider, MD  ciprofloxacin (CIPRO) 500 MG tablet Take 1 tablet (500 mg total) by mouth 2 (two) times daily. 02/08/15   Henry Hoff, MD  fenofibrate (TRICOR) 145 MG tablet Take 145 mg by mouth daily.    Historical Provider, MD  hydrochlorothiazide (HYDRODIURIL) 25 MG tablet Take  25 mg by mouth daily.    Historical Provider, MD  lisinopril (PRINIVIL,ZESTRIL) 40 MG tablet Take 40 mg by mouth daily.    Historical Provider, MD  niacin 100 MG tablet Take 100 mg by mouth at bedtime.    Historical Provider, MD  traMADol (ULTRAM) 50 MG tablet Take 1 tablet (50 mg total) by mouth every 6 (six) hours as needed. Patient not taking: Reported on 05/08/2014 07/20/13   Henry Loosen, MD   BP 154/93 mmHg  Pulse 79  Temp(Src) 98.7 F (37.1 C) (Oral)  Resp 18  SpO2 100% Physical Exam  Constitutional: He is oriented to person, place, and time. He appears well-developed and well-nourished. No distress.  HENT:  Head: Normocephalic and atraumatic.  Eyes: Conjunctivae and EOM are normal.  Neck: Neck supple. No tracheal deviation present.  Cardiovascular: Normal rate, regular rhythm and normal heart sounds.   Pulses:      Radial pulses are 2+ on the left side.  Pulmonary/Chest: Effort normal and breath sounds normal. No respiratory distress.  Musculoskeletal: Normal range of motion.  Tenderness to palpation of the L shoulder and trapezius area No erythema, edema or warmth of L shoulder Significant pain with abduction of shoulder and flexion Good ROM of L elbow and L wrist  Distal sensation of L hand intact  Neurological: He is alert and oriented to person, place, and time.  Skin: Skin is warm and dry.  Psychiatric: He has a normal mood and  affect. His behavior is normal.  Nursing note and vitals reviewed.  ED Course  Procedures (including critical care time) DIAGNOSTIC STUDIES: Oxygen Saturation is 100% on RA, normal by my interpretation.    COORDINATION OF CARE: 2:02 PM-Discussed treatment plan which includes XR, Toradol injection and sling with pt at bedside and pt agreed to plan.   Labs Review Labs Reviewed - No data to display  Imaging Review Dg Shoulder Left  09/16/2015  CLINICAL DATA:  Pain for 1 day EXAM: LEFT SHOULDER - 2+ VIEW COMPARISON:  None. FINDINGS: Oblique,  Y scapular, and and axillary views were obtained. There is no demonstrable fracture or dislocation. The joint spaces appear normal. No erosive change. Visualized left lung clear. IMPRESSION: No fracture or dislocation.  No appreciable arthropathy. Electronically Signed   By: Bretta BangWilliam  Woodruff III M.D.   On: 09/16/2015 13:43   I have personally reviewed and evaluated these images and lab results as part of my medical decision-making.   EKG Interpretation None      MDM   Final diagnoses:  None   Patient presents today with left shoulder pain since last night.  No acute injury or trauma.  Xray negative.  No signs of joint infection.  Neurovascularly intact.  Patient given sling for comfort.  Given referral to orthopedics.  Stable for discharge.  Return precautions given.  I personally performed the services described in this documentation, which was scribed in my presence. The recorded information has been reviewed and is accurate.    Henry GladHeather Cadel Stairs, PA-C 09/16/15 1615  Lavera Guiseana Duo Liu, MD 09/17/15 [phone]

## 2016-11-29 ENCOUNTER — Emergency Department (HOSPITAL_COMMUNITY): Payer: Self-pay

## 2016-11-29 ENCOUNTER — Encounter (HOSPITAL_COMMUNITY): Payer: Self-pay | Admitting: Emergency Medicine

## 2016-11-29 DIAGNOSIS — I10 Essential (primary) hypertension: Secondary | ICD-10-CM | POA: Insufficient documentation

## 2016-11-29 DIAGNOSIS — Z79899 Other long term (current) drug therapy: Secondary | ICD-10-CM | POA: Insufficient documentation

## 2016-11-29 DIAGNOSIS — F17210 Nicotine dependence, cigarettes, uncomplicated: Secondary | ICD-10-CM | POA: Insufficient documentation

## 2016-11-29 DIAGNOSIS — R0789 Other chest pain: Secondary | ICD-10-CM | POA: Insufficient documentation

## 2016-11-29 DIAGNOSIS — R61 Generalized hyperhidrosis: Secondary | ICD-10-CM | POA: Insufficient documentation

## 2016-11-29 LAB — BASIC METABOLIC PANEL
Anion gap: 11 (ref 5–15)
BUN: 14 mg/dL (ref 6–20)
CALCIUM: 9.9 mg/dL (ref 8.9–10.3)
CO2: 23 mmol/L (ref 22–32)
CREATININE: 1.34 mg/dL — AB (ref 0.61–1.24)
Chloride: 104 mmol/L (ref 101–111)
Glucose, Bld: 113 mg/dL — ABNORMAL HIGH (ref 65–99)
Potassium: 4 mmol/L (ref 3.5–5.1)
SODIUM: 138 mmol/L (ref 135–145)

## 2016-11-29 LAB — CBC
HCT: 43.1 % (ref 39.0–52.0)
Hemoglobin: 15 g/dL (ref 13.0–17.0)
MCH: 31.5 pg (ref 26.0–34.0)
MCHC: 34.8 g/dL (ref 30.0–36.0)
MCV: 90.5 fL (ref 78.0–100.0)
PLATELETS: 191 10*3/uL (ref 150–400)
RBC: 4.76 MIL/uL (ref 4.22–5.81)
RDW: 13 % (ref 11.5–15.5)
WBC: 12.4 10*3/uL — ABNORMAL HIGH (ref 4.0–10.5)

## 2016-11-29 LAB — I-STAT TROPONIN, ED: Troponin i, poc: 0.01 ng/mL (ref 0.00–0.08)

## 2016-11-29 NOTE — ED Notes (Signed)
Pt presents to ED for assessment of right sided chest pain intermittent "for a while", but sudden onset today at work after he was reaching and pulling.  Pt is a [employer].  Pt c/o increased pain with lifting and movement.  Pt c/o SOB, diaphoresis, loose stools, intermittent headaches, light-headedness and changes in BP

## 2016-11-30 ENCOUNTER — Emergency Department (HOSPITAL_COMMUNITY)
Admission: EM | Admit: 2016-11-30 | Discharge: 2016-11-30 | Disposition: A | Discharge status: Home / Self Care | Payer: Self-pay | Attending: Emergency Medicine | Admitting: Emergency Medicine

## 2016-11-30 ENCOUNTER — Other Ambulatory Visit: Payer: Self-pay

## 2016-11-30 DIAGNOSIS — R0789 Other chest pain: Secondary | ICD-10-CM

## 2016-11-30 LAB — I-STAT TROPONIN, ED: Troponin i, poc: 0 ng/mL (ref 0.00–0.08)

## 2016-11-30 MED ORDER — HYDROCHLOROTHIAZIDE 25 MG PO TABS: 25.0000 mg | ORAL_TABLET | Freq: Every day | ORAL | 0 refills | Status: DC

## 2016-11-30 MED ORDER — AMLODIPINE BESYLATE 10 MG PO TABS: 10.0000 mg | ORAL_TABLET | Freq: Every day | ORAL | 30 refills | Status: DC

## 2016-11-30 MED ORDER — AMLODIPINE BESYLATE 5 MG PO TABS: 5.0000 mg | ORAL_TABLET | Freq: Every day | ORAL | 0 refills | Status: DC

## 2016-11-30 NOTE — ED Provider Notes (Signed)
[facility]-EMERGENCY DEPT Provider Note   CSN: [id number] Arrival date & time: 11/29/16  2052     History   Chief Complaint Chief Complaint  Patient presents with  . Chest Pain    HPI Henry Morrow is a 40 y.o. male with the past medical history of hypertension presenting today with chest pain. Patient states the pain has been on the right and left sides. A goes back and forth. Pain is described as pressure without any radiation. He has associated diaphoresis. He denies shortness of breath or vomiting. 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 He denies any exertional component to this. He's had no history of blood clots, long distance travel, recent surgery, or pain and swelling in the calf. He denies any cardiac history. His concern he may have a pneumonia but denies any cough or fever. There are no further complaints.       10 Systems reviewed and are negative for acute change except as noted in the HPI.  HPI  Past Medical History:  Diagnosis Date  . Hypertension   . Obesity   . Sleep apnea     There are no active problems to display for this patient.   Past Surgical History:  Procedure Laterality Date  . HERNIA REPAIR         Home Medications    Prior to Admission medications   Medication Sig Start Date End Date Taking? Authorizing Provider  amLODipine (NORVASC) 5 MG tablet Take 5 mg by mouth daily.    [provider]  ciprofloxacin (CIPRO) 500 MG tablet Take 1 tablet (500 mg total) by mouth 2 (two) times daily. 02/08/15   Linna Hoff, MD  fenofibrate (TRICOR) 145 MG tablet Take 145 mg by mouth daily.    [provider]  hydrochlorothiazide (HYDRODIURIL) 25 MG tablet Take 25 mg by mouth daily.    [provider]  lisinopril (PRINIVIL,ZESTRIL) 40 MG tablet Take 40 mg by mouth daily.    [provider]  naproxen (NAPROSYN) 500 MG tablet Take 1 tablet (500 mg total) by mouth 2 (two) times  daily. 09/16/15   Santiago Glad, PA-C  niacin 100 MG tablet Take 100 mg by mouth at bedtime.    [provider]  traMADol (ULTRAM) 50 MG tablet Take 1 tablet (50 mg total) by mouth every 6 (six) hours as needed. 09/16/15   Santiago Glad, PA-C    Family History History reviewed. No pertinent family history.  Social History Social History  Substance Use Topics  . Smoking status: Current Every Day Smoker    Packs/day: 0.25  . Smokeless tobacco: Never Used  . Alcohol use No     Allergies   Patient has no known allergies.   Review of Systems Review of Systems   Physical Exam Updated Vital Signs BP (!) 132/91   Pulse (!) 55   Temp (!) 97.5 F (36.4 C)   Resp 14   SpO2 99%   Physical Exam  Constitutional: He is oriented to person, place, and time. Vital signs are normal. He appears well-developed and well-nourished.  Non-toxic appearance. He does not appear ill. No distress.  HENT:  Head: Normocephalic and atraumatic.  Nose: Nose normal.  Mouth/Throat: Oropharynx is clear and moist. No oropharyngeal exudate.  Eyes: Pupils are equal, round, and reactive to light. Conjunctivae and EOM are normal. No scleral icterus.  Neck: Normal range of motion. Neck supple. No tracheal deviation, no edema,  no erythema and normal range of motion present. No thyroid mass and no thyromegaly present.  Cardiovascular: Normal rate, regular rhythm, S1 normal, S2 normal, normal heart sounds, intact distal pulses and normal pulses.  Exam reveals no gallop and no friction rub.   No murmur heard. Pulmonary/Chest: Effort normal and breath sounds normal. No respiratory distress. He has no wheezes. He has no rhonchi. He has no rales.  Abdominal: Soft. Normal appearance and bowel sounds are normal. He exhibits no distension, no ascites and no mass. There is no hepatosplenomegaly. There is no tenderness. There is no rebound, no guarding and no CVA tenderness.  Musculoskeletal: Normal range of  motion. He exhibits no edema or tenderness.  Lymphadenopathy:    He has no cervical adenopathy.  Neurological: He is alert and oriented to person, place, and time. He has normal strength. No cranial nerve deficit or sensory deficit.  Skin: Skin is warm, dry and intact. No petechiae and no rash noted. He is not diaphoretic. No erythema. No pallor.  Nursing note and vitals reviewed.    ED Treatments / Results  Labs (all labs ordered are listed, but only abnormal results are displayed) Labs Reviewed  BASIC METABOLIC PANEL - Abnormal; Notable for the following:       Result Value   Glucose, Bld 113 (*)    Creatinine, Ser 1.34 (*)    All other components within normal limits  CBC - Abnormal; Notable for the following:    WBC 12.4 (*)    All other components within normal limits  I-STAT TROPONIN, ED  I-STAT TROPONIN, ED    EKG  EKG Interpretation None       Radiology Dg Chest 2 View  Result Date: 11/29/2016 CLINICAL DATA:  Severe right-sided chest pain. Symptoms for 3 weeks or more. Symptoms off and on but severe today. Shortness of breath. EXAM: CHEST  2 VIEW COMPARISON:  06/25/2015 FINDINGS: Film is made with shallow lung inflation. Heart size is normal. There are no focal consolidations or pleural effusions. No pulmonary edema. Visualized osseous structures have a normal appearance. IMPRESSION: No evidence for acute  abnormality.  Shallow lung inflation. Electronically Signed   By: Norva PavlovElizabeth  Brown M.D.   On: 11/29/2016 22:03    Procedures Procedures (including critical care time)  Medications Ordered in ED Medications - No data to display   Initial Impression / Assessment and Plan / ED Course  I have reviewed the triage vital signs and the nursing notes.  Pertinent labs & imaging results that were available during my care of the patient were reviewed by me and considered in my medical decision making (see chart for details).     Patient presents to the ED for chest  pain. History is not consistent with ACS. He has no radiation of the pain, no vomiting or SOB.  Pain has moved from right to left.  Repeat troponin and EKG are unremarkable. He continues to appear well and in NAD. VS remain within his normal limits and he is safe for DC.  Final Clinical Impressions(s) / ED Diagnoses   Final diagnoses:  Atypical chest pain    New Prescriptions New Prescriptions   No medications on file     Tomasita Crumbleni, Molly Maselli, MD 11/30/16 [phone]

## 2016-11-30 NOTE — ED Notes (Signed)
Updated on delays and wait time 

## 2018-06-18 ENCOUNTER — Other Ambulatory Visit: Payer: Self-pay | Admitting: Family Medicine

## 2018-06-18 DIAGNOSIS — M79661 Pain in right lower leg: Secondary | ICD-10-CM

## 2018-06-19 ENCOUNTER — Ambulatory Visit
Admission: RE | Admit: 2018-06-19 | Discharge: 2018-06-19 | Disposition: A | Discharge status: Home / Self Care | Payer: 59 | Source: Ambulatory Visit | Attending: Family Medicine | Admitting: Family Medicine

## 2018-06-19 ENCOUNTER — Other Ambulatory Visit: Payer: Self-pay

## 2018-06-19 DIAGNOSIS — M79661 Pain in right lower leg: Secondary | ICD-10-CM

## 2018-07-05 ENCOUNTER — Ambulatory Visit (INDEPENDENT_AMBULATORY_CARE_PROVIDER_SITE_OTHER): Payer: 59 | Admitting: Orthopaedic Surgery

## 2018-07-05 ENCOUNTER — Ambulatory Visit (INDEPENDENT_AMBULATORY_CARE_PROVIDER_SITE_OTHER): Payer: 59

## 2018-07-05 ENCOUNTER — Encounter (INDEPENDENT_AMBULATORY_CARE_PROVIDER_SITE_OTHER): Payer: Self-pay | Admitting: Orthopaedic Surgery

## 2018-07-05 ENCOUNTER — Other Ambulatory Visit: Payer: Self-pay

## 2018-07-05 VITALS — Ht 70.0 in | Wt 313.0 lb

## 2018-07-05 DIAGNOSIS — M25572 Pain in left ankle and joints of left foot: Secondary | ICD-10-CM | POA: Diagnosis not present

## 2018-07-05 DIAGNOSIS — M25571 Pain in right ankle and joints of right foot: Secondary | ICD-10-CM

## 2018-07-05 DIAGNOSIS — M79671 Pain in right foot: Secondary | ICD-10-CM

## 2018-07-05 DIAGNOSIS — G8929 Other chronic pain: Secondary | ICD-10-CM | POA: Diagnosis not present

## 2018-07-05 DIAGNOSIS — Z6841 Body Mass Index (BMI) 40.0 and over, adult: Secondary | ICD-10-CM | POA: Diagnosis not present

## 2018-07-05 NOTE — Progress Notes (Signed)
Office Visit Note   [name]           Date of Birth: 1976-01-21           MRN: [id number] Visit Date: 07/05/2018              Requested by: No referring provider defined for this encounter. PCP: System, Provider Not In   Assessment & Plan: Visit Diagnoses:  1. Chronic pain of both ankles   2. Right foot pain   3. Body mass index 40.0-44.9, adult (HCC)     Plan: We will longitudinal arch supports supplied in appropriate position discussed he will put these in the shoes that he wears most during the day.  Bilateral Visco heel inserts that he can use.  We discussed getting some super feet for his boots to help with his flexible flat feet and help unload midfoot dorsally which is symptomatic.  He can return if he is having persistent problems.  X-rays were reviewed we discussed how he can obtain the super feet which currently would have to be on lines and stores a supply at low clear all closed.  He can return if he is having persistent problems.  We discussed using a frozen orange juice can also cord stretches on a step laying in his heels hanging down.  Pathophysiology discussed return PRN.  Follow-Up Instructions: Return if symptoms worsen or fail to improve.   Orders:  Orders Placed This Encounter  Procedures  . XR Ankle Complete Left  . XR Ankle Complete Right  . XR Foot 2 Views Right   No orders of the defined types were placed in this encounter.     Procedures: No procedures performed   Clinical Data: No additional findings.   Subjective: Chief Complaint  Patient presents with  . Right Foot - Pain  . Right Ankle - Pain  . Left Ankle - Pain    HPI 43 year old male seen with bilateral foot pain.  He is on his feet during the day.  He has had previous plantar fascia injections that helped for a few days or a week.  He has night splints that he has been wearing without relief.  BMI is greater than 44 and he states he is having trouble doing  his work.  He is taken meloxicam which also has not helped.  No history of gout no history of foot fractures.  Review of Systems positive for morbid obesity, chronic bilateral foot pain.  Positive for sleep apnea previous hernia repair otherwise negative is a pertains HPI.   Objective: Vital Signs: Ht 5\' 10"  (1.778 m)   Wt (!) 313 lb (142 kg)   BMI 44.91 kg/m   Physical Exam Constitutional:      Appearance: He is well-developed.  HENT:     Head: Normocephalic and atraumatic.  Eyes:     Pupils: Pupils are equal, round, and reactive to light.  Neck:     Thyroid: No thyromegaly.     Trachea: No tracheal deviation.  Cardiovascular:     Rate and Rhythm: Normal rate.  Pulmonary:     Effort: Pulmonary effort is normal.     Breath sounds: No wheezing.  Abdominal:     General: Bowel sounds are normal.     Palpations: Abdomen is soft.  Skin:    General: Skin is warm and dry.     Capillary Refill: Capillary refill takes less than 2 seconds.  Neurological:     Mental  Status: He is alert and oriented to person, place, and time.  Psychiatric:        Behavior: Behavior normal.        Thought Content: Thought content normal.        Judgment: Judgment normal.     Ortho Exam patient walks and external rotation gait flexible pes planus bilaterally.  Exquisite tenderness over the plantar fascial origin worse on the right than left foot.  Posterior tib peroneals are active.  Some tenderness over the dorsal midfoot.  Ankle range of motion is full.  No Achilles tendon contracture.  Negative logroll to the hips knees reach full extension.  Specialty Comments:  No specialty comments available.  Imaging: Xr Ankle Complete Left  Result Date: 07/05/2018 Three-view x-rays left ankle obtained AP lateral mortise view.  Negative for degenerative changes in the ankle negative for acute fracture.  Plantar fascial spur noted at the calcaneus with some soft tissue calcification in the plantar fascia  adjacent to the spur. Impression: Left ankle negative for acute changes.  Changes consistent with chronic plantar fasciitis noted.  Xr Ankle Complete Right  Result Date: 07/05/2018 AP lateral and mortise x-rays obtained right ankle.  This is negative for acute changes no subluxation. Impression: Normal right ankle x-rays.  Xr Foot 2 Views Right  Result Date: 07/05/2018 2 view x-rays right foot obtained AP and lateral.  This shows plantar fascial origin spur.  No subluxation negative for acute changes no significant degenerative changes. Impression: Right foot negative for acute changes.  Plantar fascial calcaneal spur noted.    PMFS History: There are no active problems to display for this patient.  Past Medical History:  Diagnosis Date  . Hypertension   . Obesity   . Sleep apnea     No family history on file.  Past Surgical History:  Procedure Laterality Date  . HERNIA REPAIR     Social History   Occupational History  . Not on file  Tobacco Use  . Smoking status: Current Every Day Smoker    Packs/day: 0.25  . Smokeless tobacco: Never Used  Substance and Sexual Activity  . Alcohol use: No  . Drug use: No  . Sexual activity: Not on file

## 2019-02-07 ENCOUNTER — Encounter: Payer: Self-pay | Admitting: Orthopaedic Surgery

## 2019-02-07 ENCOUNTER — Other Ambulatory Visit: Payer: Self-pay

## 2019-02-07 ENCOUNTER — Ambulatory Visit (INDEPENDENT_AMBULATORY_CARE_PROVIDER_SITE_OTHER): Payer: Managed Care, Other (non HMO) | Admitting: Orthopaedic Surgery

## 2019-02-07 DIAGNOSIS — Q6650 Congenital pes planus, unspecified foot: Secondary | ICD-10-CM

## 2019-02-07 DIAGNOSIS — M722 Plantar fascial fibromatosis: Secondary | ICD-10-CM

## 2019-02-07 NOTE — Progress Notes (Signed)
Office Visit Note   [name]           Date of Birth: February 19, 1976           MRN: [id number] Visit Date: 02/07/2019              Requested by: No referring provider defined for this encounter. PCP: System, Provider Not In   Assessment & Plan: Visit Diagnoses:  1. Mobile pes planus   2. Plantar fasciitis, bilateral     Plan: Patient can order some super feet blue color inserts that are used with his tennis shoes.  New Visco heel inserts given that he can use for his plantar fasciitis.  We discussed stretching plantar fascial as before.  Follow-up as needed.  Follow-Up Instructions: No follow-ups on file.   Orders:  No orders of the defined types were placed in this encounter.  No orders of the defined types were placed in this encounter.     Procedures: No procedures performed   Clinical Data: No additional findings.   Subjective: Chief Complaint  Patient presents with  . Right Foot - Pain  . Left Foot - Pain    HPI 43 year old male works at [employer] and  walking 15-20,000 steps daily returns for follow-up of bilateral pes planus and also bilateral plantar fasciitis.  He is requesting some new Visco inserts which we supplied him with previously since the old pair is worn out.  He tried to buy some online but they have not held up.  He states he could not remember the super feet recommendation.I wrote on a piece of paper and he states he lost the paper.  Patient does better in tennis shoes and boots he is used sketchers and new balance.  Pain is worse at the end of the day with pain in both right and left foot.  Chronic pes planus.  Review of Systems reviewed updated unchanged from 07/05/2018 office visit other than as mentioned HPI.   Objective: Vital Signs: BP (!) 157/100   Pulse 77   Physical Exam Constitutional:      Appearance: He is well-developed.  HENT:     Head: Normocephalic and atraumatic.  Eyes:     Pupils:  Pupils are equal, round, and reactive to light.  Neck:     Thyroid: No thyromegaly.     Trachea: No tracheal deviation.  Cardiovascular:     Rate and Rhythm: Normal rate.  Pulmonary:     Effort: Pulmonary effort is normal.     Breath sounds: No wheezing.  Abdominal:     General: Bowel sounds are normal.     Palpations: Abdomen is soft.  Skin:    General: Skin is warm and dry.     Capillary Refill: Capillary refill takes less than 2 seconds.  Neurological:     Mental Status: He is alert and oriented to person, place, and time.  Psychiatric:        Behavior: Behavior normal.        Thought Content: Thought content normal.        Judgment: Judgment normal.     Ortho Exam patient has midfoot arch straps right and left made by sketcher.  He has tenderness over the plantar fascial origin no evidence of plantar fascia nodules.  Tenderness over the plantar fascial origin.  No significant heel cord contracture.  No plantar foot lesions. Specialty Comments:  No specialty comments available.  Imaging: No results found.  PMFS History: Patient Active Problem List   Diagnosis Date Noted  . Mobile pes planus 02/07/2019  . Plantar fasciitis, bilateral 02/07/2019   Past Medical History:  Diagnosis Date  . Hypertension   . Obesity   . Sleep apnea     No family history on file.  Past Surgical History:  Procedure Laterality Date  . HERNIA REPAIR     Social History   Occupational History  . Not on file  Tobacco Use  . Smoking status: Current Every Day Smoker    Packs/day: 0.25  . Smokeless tobacco: Never Used  Substance and Sexual Activity  . Alcohol use: No  . Drug use: No  . Sexual activity: Not on file

## 2020-11-07 ENCOUNTER — Ambulatory Visit (HOSPITAL_COMMUNITY)
Admission: EM | Admit: 2020-11-07 | Discharge: 2020-11-07 | Disposition: A | Discharge status: Home / Self Care | Payer: 59 | Attending: Emergency Medicine | Admitting: Emergency Medicine

## 2020-11-07 ENCOUNTER — Encounter (HOSPITAL_COMMUNITY): Payer: Self-pay | Admitting: *Deleted

## 2020-11-07 ENCOUNTER — Other Ambulatory Visit: Payer: Self-pay

## 2020-11-07 DIAGNOSIS — J36 Peritonsillar abscess: Secondary | ICD-10-CM

## 2020-11-07 MED ORDER — IBUPROFEN 800 MG PO TABS: 800.0000 mg | ORAL_TABLET | Freq: Three times a day (TID) | ORAL | 0 refills | Status: DC

## 2020-11-07 MED ORDER — AMOXICILLIN-POT CLAVULANATE 875-125 MG PO TABS: 1.0000 | ORAL_TABLET | Freq: Two times a day (BID) | ORAL | 0 refills | Status: AC

## 2020-11-07 NOTE — Discharge Instructions (Addendum)
Take the Augmentin twice a day for the next 10 days.    You can take the Ibuprofen as needed for pain. You can also take Tylenol as needed.    Follow up with ENT for further evaluation.   Go to the Emergency Department for any worsening pain, swelling, trouble swallowing, or difficulty breathing.

## 2020-11-07 NOTE — ED Triage Notes (Signed)
Pt reports pain Rt ear that radiates to Rt side of neck

## 2020-11-07 NOTE — ED Provider Notes (Signed)
[facility]    CSN: [id number] Arrival date & time: 11/07/20  1705      History   Chief Complaint Chief Complaint  Patient presents with   ear pain Rt    HPI Henry Morrow is a 45 y.o. male.   Patient here for evaluation of right ear and throat pain that has been ongoing for the past two days.  Reports noticed some swelling and tenderness to right neck that was worsening today. Has not taken any OTC medications or treatments.  Denies any recent sick contacts.  Denies any trauma, injury, or other precipitating event.  Denies any specific alleviating or aggravating factors.  Denies any fevers, chest pain, shortness of breath, N/V/D, numbness, tingling, weakness, abdominal pain, or headaches.    The history is provided by the patient.   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 Authorizing Provider  amoxicillin-clavulanate (AUGMENTIN) 875-125 MG tablet Take 1 tablet by mouth every 12 (twelve) hours for 10 days. 11/07/20 11/17/20 Yes Ivette Loyal, NP  ibuprofen (ADVIL) 800 MG tablet Take 1 tablet (800 mg total) by mouth 3 (three) times daily. 11/07/20  Yes Ivette Loyal, NP  amLODipine (NORVASC) 10 MG tablet Take 1 tablet (10 mg total) by mouth daily. 11/30/16   Tomasita Crumble, MD  amLODipine (NORVASC) 5 MG tablet Take 1 tablet (5 mg total) by mouth daily. Patient not taking: Reported on 07/05/2018 11/30/16   Tomasita Crumble, MD  ciprofloxacin (CIPRO) 500 MG tablet Take 1 tablet (500 mg total) by mouth 2 (two) times daily. Patient not taking: Reported on 07/05/2018 02/08/15   Linna Hoff, MD  fenofibrate (TRICOR) 145 MG  tablet Take 145 mg by mouth daily.    [provider]  hydrochlorothiazide (HYDRODIURIL) 25 MG tablet Take 1 tablet (25 mg total) by mouth daily. 11/30/16   Tomasita Crumble, MD  hydrochlorothiazide (HYDRODIURIL) 25 MG tablet Take 1 tablet (25 mg total) by mouth daily. 11/30/16   Tomasita Crumble, MD  lisinopril (PRINIVIL,ZESTRIL) 40 MG tablet Take 40 mg by mouth daily.    [provider]  niacin 100 MG tablet Take 100 mg by mouth at bedtime.    [provider]  traMADol (ULTRAM) 50 MG tablet Take 1 tablet (50 mg total) by mouth every 6 (six) hours as needed. Patient not taking: Reported on 07/05/2018 09/16/15   Santiago Glad, PA-C    Family History History reviewed. No pertinent family history.  Social History Social History   Tobacco Use   Smoking status: Every Day    Packs/day: 0.25    Types: Cigarettes   Smokeless tobacco: Never  Substance Use Topics   Alcohol use: No   Drug use: No     Allergies   Patient has no known allergies.   Review of Systems Review of Systems  HENT:  Positive for ear pain and sore throat. Negative for trouble swallowing and voice change.   All other systems reviewed and are negative.   Physical Exam Triage Vital Signs ED Triage Vitals  Enc Vitals  Group     BP 11/07/20 1812 (!) 182/116     Pulse Rate 11/07/20 1812 78     Resp 11/07/20 1812 20     Temp 11/07/20 1812 98.6 F (37 C)     Temp src --      SpO2 11/07/20 1812 99 %     Weight --      Height --      Head Circumference --      Peak Flow --      Pain Score 11/07/20 1814 5     Pain Loc --      Pain Edu? --      Excl. in GC? --    No data found.  Updated Vital Signs BP (!) 182/116   Pulse 78   Temp 98.6 F (37 C)   Resp 20   SpO2 99%   Visual Acuity Right Eye Distance:   Left Eye Distance:   Bilateral Distance:    Right Eye Near:   Left Eye Near:    Bilateral Near:     Physical Exam Vitals and nursing note reviewed.  Constitutional:       General: He is not in acute distress.    Appearance: Normal appearance. He is not ill-appearing, toxic-appearing or diaphoretic.  HENT:     Head: Normocephalic and atraumatic.     Right Ear: Tympanic membrane, ear canal and external ear normal.     Left Ear: Tympanic membrane, ear canal and external ear normal.     Mouth/Throat:     Pharynx: Uvula midline. Oropharyngeal exudate present. No posterior oropharyngeal erythema or uvula swelling.     Tonsils: Tonsillar exudate (only on right tonsil) and tonsillar abscess (right) present. 2+ on the right. 0 on the left.  Eyes:     Conjunctiva/sclera: Conjunctivae normal.  Cardiovascular:     Rate and Rhythm: Normal rate.     Pulses: Normal pulses.  Pulmonary:     Effort: Pulmonary effort is normal.  Abdominal:     General: Abdomen is flat.  Musculoskeletal:        General: Normal range of motion.     Cervical back: Normal range of motion.  Lymphadenopathy:     Head:     Right side of head: Submandibular and tonsillar adenopathy present. No submental adenopathy.  Skin:    General: Skin is warm and dry.  Neurological:     General: No focal deficit present.     Mental Status: He is alert and oriented to person, place, and time.  Psychiatric:        Mood and Affect: Mood normal.     UC Treatments / Results  Labs (all labs ordered are listed, but only abnormal results are displayed) Labs Reviewed - No data to display  EKG   Radiology No results found.  Procedures Procedures (including critical care time)  Medications Ordered in UC Medications - No data to display  Initial Impression / Assessment and Plan / UC Course  I have reviewed the triage vital signs and the nursing notes.  Pertinent labs & imaging results that were available during my care of the patient were reviewed by me and considered in my medical decision making (see chart for details).    Assessment negative for red flags or concerns.  Likely tonsillar  abscess.  Will treat with augmentin.  May take Ibuprofen and tylenol as needed to pain.  Discussed conservative symptom management.  Recommend follow up with ENT for  further evaluation.  Strict ED follow up for any red flag symptoms.  Final Clinical Impressions(s) / UC Diagnoses   Final diagnoses:  Tonsillar abscess     Discharge Instructions      Take the Augmentin twice a day for the next 10 days.    You can take the Ibuprofen as needed for pain. You can also take Tylenol as needed.    Follow up with ENT for further evaluation.   Go to the Emergency Department for any worsening pain, swelling, trouble swallowing, or difficulty breathing.       ED Prescriptions     Medication Sig Dispense Auth. Provider   amoxicillin-clavulanate (AUGMENTIN) 875-125 MG tablet Take 1 tablet by mouth every 12 (twelve) hours for 10 days. 20 tablet Ivette Loyal, NP   ibuprofen (ADVIL) 800 MG tablet Take 1 tablet (800 mg total) by mouth 3 (three) times daily. 21 tablet Ivette Loyal, NP      PDMP not reviewed this encounter.   Ivette Loyal, NP 11/07/20 [phone]

## 2020-11-15 ENCOUNTER — Encounter (HOSPITAL_COMMUNITY): Payer: Self-pay | Admitting: Emergency Medicine

## 2020-11-15 ENCOUNTER — Emergency Department (HOSPITAL_COMMUNITY)
Admission: EM | Admit: 2020-11-15 | Discharge: 2020-11-16 | Disposition: A | Discharge status: Home / Self Care | Payer: Self-pay | Attending: Emergency Medicine | Admitting: Emergency Medicine

## 2020-11-15 ENCOUNTER — Emergency Department (HOSPITAL_COMMUNITY): Payer: Self-pay

## 2020-11-15 ENCOUNTER — Other Ambulatory Visit: Payer: Self-pay

## 2020-11-15 DIAGNOSIS — H539 Unspecified visual disturbance: Secondary | ICD-10-CM | POA: Insufficient documentation

## 2020-11-15 DIAGNOSIS — F17210 Nicotine dependence, cigarettes, uncomplicated: Secondary | ICD-10-CM | POA: Insufficient documentation

## 2020-11-15 DIAGNOSIS — R0602 Shortness of breath: Secondary | ICD-10-CM | POA: Insufficient documentation

## 2020-11-15 DIAGNOSIS — Z79899 Other long term (current) drug therapy: Secondary | ICD-10-CM | POA: Insufficient documentation

## 2020-11-15 DIAGNOSIS — R030 Elevated blood-pressure reading, without diagnosis of hypertension: Secondary | ICD-10-CM

## 2020-11-15 DIAGNOSIS — I10 Essential (primary) hypertension: Secondary | ICD-10-CM | POA: Insufficient documentation

## 2020-11-15 DIAGNOSIS — Z20822 Contact with and (suspected) exposure to covid-19: Secondary | ICD-10-CM | POA: Insufficient documentation

## 2020-11-15 DIAGNOSIS — R519 Headache, unspecified: Secondary | ICD-10-CM | POA: Insufficient documentation

## 2020-11-15 LAB — CBC
HCT: 41 % (ref 39.0–52.0)
Hemoglobin: 13.9 g/dL (ref 13.0–17.0)
MCH: 31 pg (ref 26.0–34.0)
MCHC: 33.9 g/dL (ref 30.0–36.0)
MCV: 91.3 fL (ref 80.0–100.0)
Platelets: 202 10*3/uL (ref 150–400)
RBC: 4.49 MIL/uL (ref 4.22–5.81)
RDW: 12.4 % (ref 11.5–15.5)
WBC: 8.4 10*3/uL (ref 4.0–10.5)
nRBC: 0 % (ref 0.0–0.2)

## 2020-11-15 LAB — BASIC METABOLIC PANEL
Anion gap: 6 (ref 5–15)
BUN: 12 mg/dL (ref 6–20)
CO2: 26 mmol/L (ref 22–32)
Calcium: 9.3 mg/dL (ref 8.9–10.3)
Chloride: 105 mmol/L (ref 98–111)
Creatinine, Ser: 1.24 mg/dL (ref 0.61–1.24)
GFR, Estimated: >60 mL/min (ref >60)
Glucose, Bld: 124 mg/dL — ABNORMAL HIGH (ref 70–99)
Potassium: 4 mmol/L (ref 3.5–5.1)
Sodium: 137 mmol/L (ref 135–145)

## 2020-11-15 LAB — RESP PANEL BY RT-PCR (FLU A&B, COVID) ARPGX2
Influenza A by PCR: NEGATIVE
Influenza B by PCR: NEGATIVE
SARS Coronavirus 2 by RT PCR: NEGATIVE

## 2020-11-15 LAB — TROPONIN I (HIGH SENSITIVITY)
Troponin I (High Sensitivity): 16 ng/L (ref <18)
Troponin I (High Sensitivity): 15 ng/L (ref <18)

## 2020-11-15 MED ORDER — LACTATED RINGERS IV BOLUS
1000.0000 mL | Freq: Once | INTRAVENOUS | Status: AC
  Administered 2020-11-16: 1000 mL via INTRAVENOUS

## 2020-11-15 MED ORDER — IOHEXOL 350 MG/ML SOLN
75.0000 mL | Freq: Once | INTRAVENOUS | Status: AC | PRN
  Administered 2020-11-15: 75 mL via INTRAVENOUS

## 2020-11-15 MED ORDER — LABETALOL HCL 5 MG/ML IV SOLN
20.0000 mg | Freq: Once | INTRAVENOUS | Status: AC
  Administered 2020-11-15: 20 mg via INTRAVENOUS
  Filled 2020-11-15: qty 4

## 2020-11-15 NOTE — ED Notes (Signed)
Pt to MRI

## 2020-11-15 NOTE — ED Provider Notes (Signed)
Emergency Medicine Provider Triage Evaluation Note  Henry Morrow , a 45 y.o. male  was evaluated in triage.  Pt complains of bilat diploplia that started on 8/10. States when he covers up 1 eye at a time he has blurred vision bilaterally. Reports some mild chest pain  No taking bp meds  Review of Systems  Positive: Diploplia, chest pain, sob, headaches Negative: fevers  Physical Exam  BP (!) 211/123 (BP Location: Right Arm)   Pulse 78   Temp 98.4 F (36.9 C) (Oral)   Ht 5\' 10"  (1.778 m)   Wt 136.1 kg   SpO2 100%   BMI 43.05 kg/m  Gen:   Awake, no distress   Resp:  Normal effort  MSK:   Moves extremities without difficulty  Other:  No facial droop, 5/5 strength to upper and lower extremities, heart rrr, lungs clear  Medical Decision Making  Medically screening exam initiated at 7:56 PM.  Appropriate orders placed.  Henry Morrow was informed that the remainder of the evaluation will be completed by another provider, this initial triage assessment does not replace that evaluation, and the importance of remaining in the ED until their evaluation is complete.     Luberta Robertson, PA-C 11/15/20 1956    11/17/20, DO 11/15/20 2251

## 2020-11-15 NOTE — ED Notes (Signed)
Pt to CT

## 2020-11-15 NOTE — ED Provider Notes (Signed)
[facility] EMERGENCY DEPARTMENT Provider Note   CSN: [id number] Arrival date & time: 11/15/20  1922     History Chief Complaint  Patient presents with   Shortness of Breath   Visual Field Change    Henry Morrow is a 45 y.o. male.   Shortness of Breath Associated symptoms: no abdominal pain, no chest pain, no cough, no ear pain, no fever, no headaches, no rash, no sore throat and no vomiting    45 year old male with a past medical history of obesity, OSA, hypertension presenting to the emergency department with a headache and double vision.  Patient reports that his symptoms have been present for the past 5 days.  They are persistent, constant, not necessarily worsening.  Patient states that he started taking ibuprofen around 7 days ago for a sore throat and his symptoms began 2 days after that.  He denies any blurry vision.  He reports that he had a diffuse, generalized headache that was constant, did not wake him at night, not worse in the morning.  It was not thunderclap in onset.  He states that after that he developed double vision whenever he looked over to the left.  He started wearing an eye patch over his left eye and this would resolve his double vision.  He denies any chest pain.  Patient reported shortness of breath to the triage personnel, but he denies any significant shortness of breath to me after being roomed in the emergency department.  Patient states that he has not been on antihypertensives for many years as he was not able to go to his doctors appointments due to his demanding career.  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  Social History   Tobacco  Use   Smoking status: Every Day    Packs/day: 0.25    Types: Cigarettes   Smokeless tobacco: Never  Substance Use Topics   Alcohol use: No   Drug use: No    Home Medications Prior to Admission medications   Medication Sig Start Date End Date Taking? Authorizing Provider  amLODipine (NORVASC) 10 MG tablet Take 1 tablet (10 mg total) by mouth daily. 11/30/16   Tomasita Crumble, MD  amLODipine (NORVASC) 5 MG tablet Take 1 tablet (5 mg total) by mouth daily. Patient not taking: Reported on 07/05/2018 11/30/16   Tomasita Crumble, MD  amoxicillin-clavulanate (AUGMENTIN) 875-125 MG tablet Take 1 tablet by mouth every 12 (twelve) hours for 10 days. 11/07/20 11/17/20  Ivette Loyal, NP  ciprofloxacin (CIPRO) 500 MG tablet Take 1 tablet (500 mg total) by mouth 2 (two) times daily. Patient not taking: Reported on 07/05/2018 02/08/15   Linna Hoff, MD  fenofibrate (TRICOR) 145 MG tablet Take 145 mg by mouth daily.    [provider]  hydrochlorothiazide (HYDRODIURIL) 25 MG tablet Take 1 tablet (25 mg total) by mouth daily. 11/30/16   Tomasita Crumble, MD  hydrochlorothiazide (HYDRODIURIL) 25 MG tablet Take 1 tablet (25 mg total) by mouth daily. 11/30/16   Tomasita Crumble, MD  ibuprofen (ADVIL) 800 MG tablet Take 1 tablet (800 mg total) by mouth 3 (three)  times daily. 11/07/20   Ivette Loyal, NP  lisinopril (PRINIVIL,ZESTRIL) 40 MG tablet Take 40 mg by mouth daily.    [provider]  niacin 100 MG tablet Take 100 mg by mouth at bedtime.    [provider]  traMADol (ULTRAM) 50 MG tablet Take 1 tablet (50 mg total) by mouth every 6 (six) hours as needed. Patient not taking: Reported on 07/05/2018 09/16/15   Santiago Glad, PA-C    Allergies    Patient has no known allergies.  Review of Systems   Review of Systems  Constitutional:  Negative for chills and fever.  HENT:  Negative for ear pain and sore throat.   Eyes:  Positive for visual disturbance. Negative for pain, discharge, redness  and itching.  Respiratory:  Negative for cough and shortness of breath.   Cardiovascular:  Negative for chest pain and palpitations.  Gastrointestinal:  Negative for abdominal pain and vomiting.  Genitourinary:  Negative for dysuria and hematuria.  Musculoskeletal:  Negative for arthralgias and back pain.  Skin:  Negative for color change and rash.  Neurological:  Negative for dizziness, tremors, seizures, syncope, facial asymmetry, speech difficulty, weakness, light-headedness, numbness and headaches.  All other systems reviewed and are negative.  Physical Exam Updated Vital Signs BP (!) 162/96   Pulse 75   Temp 98.4 F (36.9 C) (Oral)   Resp 14   Ht 5\' 10"  (1.778 m)   Wt 136.1 kg   SpO2 99%   BMI 43.05 kg/m   Physical Exam Vitals and nursing note reviewed.  Constitutional:      General: He is not in acute distress.    Appearance: He is well-developed. He is obese. He is not ill-appearing or toxic-appearing.  HENT:     Head: Normocephalic and atraumatic.  Eyes:     Conjunctiva/sclera: Conjunctivae normal.  Cardiovascular:     Rate and Rhythm: Normal rate and regular rhythm.     Heart sounds: No murmur heard. Pulmonary:     Effort: Pulmonary effort is normal. No respiratory distress.     Breath sounds: Normal breath sounds.  Abdominal:     Palpations: Abdomen is soft.     Tenderness: There is no abdominal tenderness.  Musculoskeletal:     Cervical back: Neck supple.  Skin:    General: Skin is warm and dry.     Capillary Refill: Capillary refill takes less than 2 seconds.  Neurological:     Mental Status: He is alert and oriented to person, place, and time.     Comments: 5 out of 5 strength in the large flexors and extensors of the upper and lower extremities bilaterally.  Normal, narrow-based and steady gait.  No past-pointing.  No dysdiadochokinesia.  Normal shrug.  Normal facial sensation.  Normal facial activation.  On extraocular movement testing, vertical gaze is  conjugate and intact.  On lateral gaze testing, with left lateral gaze the eyes cross midline and appear conjugate, but the patient has an immediate episode of diplopia and closes his eyes   ED Results / Procedures / Treatments   Labs (all labs ordered are listed, but only abnormal results are displayed) Labs Reviewed  BASIC METABOLIC PANEL - Abnormal; Notable for the following components:      Result Value   Glucose, Bld 124 (*)    All other components within normal limits  RESP PANEL BY RT-PCR (FLU A&B, COVID) ARPGX2  CBC  TROPONIN I (HIGH SENSITIVITY)  TROPONIN I (HIGH SENSITIVITY)  EKG None  Radiology DG Chest 2 View  Result Date: 11/15/2020 CLINICAL DATA:  Chest pain EXAM: CHEST - 2 VIEW COMPARISON:  11/29/2016 FINDINGS: Cardiac shadow is prominent but stable. The lungs are well aerated bilaterally. No focal infiltrate or effusion is seen. No bony abnormality is noted. IMPRESSION: No acute abnormality noted. Electronically Signed   By: Alcide Clever M.D.   On: 11/15/2020 20:49   CT HEAD WO CONTRAST ( )  Result Date: 11/15/2020 CLINICAL DATA:  Shortness of breath and double vision. EXAM: CT HEAD WITHOUT CONTRAST TECHNIQUE: Contiguous axial images were obtained from the base of the skull through the vertex without intravenous contrast. COMPARISON:  May 12, 2011 FINDINGS: Brain: No evidence of acute infarction, hemorrhage, hydrocephalus, extra-axial collection or mass lesion/mass effect. Vascular: No hyperdense vessel or unexpected calcification. Skull: Normal. Negative for fracture or focal lesion. Sinuses/Orbits: A 1.5 cm x 1.3 cm polyp versus mucous retention cyst is seen within the anteromedial aspect of the left maxillary sinus. Other: None. IMPRESSION: No acute intracranial abnormality. Electronically Signed   By: Aram Candela M.D.   On: 11/15/2020 21:20   CT ANGIO NECK CODE STROKE  Result Date: 11/15/2020 CLINICAL DATA:  Neck pain and diplopia EXAM: CT ANGIOGRAPHY  NECK TECHNIQUE: Multidetector CT imaging of the neck was performed using the standard protocol during bolus administration of intravenous contrast. Multiplanar CT image reconstructions and MIPs were obtained to evaluate the vascular anatomy. Carotid stenosis measurements (when applicable) are obtained utilizing NASCET criteria, using the distal internal carotid diameter as the denominator. CONTRAST:  63mL OMNIPAQUE IOHEXOL 350 MG/ML SOLN COMPARISON:  None. FINDINGS: SKELETON: There is no bony spinal canal stenosis. No lytic or blastic lesion. OTHER NECK: Normal pharynx, larynx and major salivary glands. No cervical lymphadenopathy. Unremarkable thyroid gland. UPPER CHEST: No pneumothorax or pleural effusion. No nodules or masses. AORTIC ARCH: There is no calcific atherosclerosis of the aortic arch. There is no aneurysm, dissection or hemodynamically significant stenosis of the visualized portion of the aorta. Conventional 3 vessel aortic branching pattern. The visualized proximal subclavian arteries are widely patent. RIGHT CAROTID SYSTEM: Normal without aneurysm, dissection or stenosis. LEFT CAROTID SYSTEM: Normal without aneurysm, dissection or stenosis. VERTEBRAL ARTERIES: Codominant configuration. Both origins are clearly patent. There is no dissection, occlusion or flow-limiting stenosis to the skull base (V1-V3 segments). Review of the MIP images confirms the above findings. IMPRESSION: Normal CTA of the neck. Electronically Signed   By: Deatra Robinson M.D.   On: 11/15/2020 23:20    Procedures Procedures   Medications Ordered in ED Medications  lactated ringers bolus 1,000 mL (has no administration in time range)  labetalol (NORMODYNE) injection 20 mg (20 mg Intravenous Given 11/15/20 2209)  iohexol (OMNIPAQUE) 350 MG/ML injection 75 mL (75 mLs Intravenous Contrast Given 11/15/20 2246)   ED Course  I have reviewed the triage vital signs and the nursing notes.  Pertinent labs & imaging results that  were available during my care of the patient were reviewed by me and considered in my medical decision making (see chart for details).   MDM Rules/Calculators/A&P                           45 year old male with hypertension presenting with headache, double vision x5 days. Vital signs reviewed on arrival, the patient's mean arterial pressure was around 150 on arrival.  Physical exam is notable for no evidence of large vessel occlusion and no frank disconjugate gaze or cranial  nerve palsy, but with left lateral gaze testing the patient has an immediate onset of double vision and is unable to keep his eyes open.  Differential includes hypertensive emergency, stroke, dissection, demyelinating lesion.  We will administer IV labetalol with a goal of reducing his mean arterial pressure by around 25%.  CT head obtained in triage is negative for an acute bleed.  The patient has a normal creatinine on lab testing and no pulmonary edema.  Overall, no other endorgan damage on testing.  We will obtain a CT of the neck as he describes some posterior neck pain associate with the symptoms.  If that is negative, we will proceed with an MRI of the brain and MRA.  We will also consult neurology.  On my reevaluation following the labetalol, the patient's blood pressure is 162/96. Patient reports that his headache has improved and he is able to maintain left lateral gaze without double vision now.  I discussed the case with the neurologist on-call.  They agreed with the MRI and MRA and then we will reengage them.  Patient care signed out to oncoming provider at midnight.  Please see their documentation for the remainder of the patient's ED care and ultimate disposition.  Final Clinical Impression(s) / ED Diagnoses Final diagnoses:  Visual disturbance    Rx / DC Orders ED Discharge Orders     None        Lenard LancePowell, Khloe Hunkele, MD 11/15/20 2353    Cheryll CockayneHong, Joshua S, MD 11/18/20 0800

## 2020-11-15 NOTE — ED Triage Notes (Signed)
Pt arrive POV from home for c/o SOB and double vision since last Friday not getting any better.

## 2020-11-16 DIAGNOSIS — G4486 Cervicogenic headache: Secondary | ICD-10-CM

## 2020-11-16 MED ORDER — KETOROLAC TROMETHAMINE 30 MG/ML IJ SOLN
30.0000 mg | Freq: Once | INTRAMUSCULAR | Status: AC
  Administered 2020-11-16: 30 mg via INTRAVENOUS
  Filled 2020-11-16: qty 1

## 2020-11-16 MED ORDER — LABETALOL HCL 5 MG/ML IV SOLN
40.0000 mg | Freq: Once | INTRAVENOUS | Status: AC
  Administered 2020-11-16: 40 mg via INTRAVENOUS
  Filled 2020-11-16: qty 8

## 2020-11-16 MED ORDER — AMLODIPINE BESYLATE 5 MG PO TABS: 5.0000 mg | ORAL_TABLET | Freq: Every day | ORAL | 0 refills | Status: DC

## 2020-11-16 MED ORDER — DEXAMETHASONE SODIUM PHOSPHATE 4 MG/ML IJ SOLN
4.0000 mg | Freq: Once | INTRAMUSCULAR | Status: AC
  Administered 2020-11-16: 4 mg via INTRAVENOUS
  Filled 2020-11-16: qty 1

## 2020-11-16 MED ORDER — HYDROCHLOROTHIAZIDE 25 MG PO TABS
25.0000 mg | ORAL_TABLET | Freq: Once | ORAL | Status: AC
  Administered 2020-11-16: 25 mg via ORAL
  Filled 2020-11-16: qty 1

## 2020-11-16 MED ORDER — HYDROCHLOROTHIAZIDE 25 MG PO TABS: 25.0000 mg | ORAL_TABLET | Freq: Every day | ORAL | 0 refills | Status: AC

## 2020-11-16 MED ORDER — SODIUM CHLORIDE 0.9 % IV BOLUS: 1000.0000 mL | Freq: Once | INTRAVENOUS | Status: DC

## 2020-11-16 MED ORDER — DIPHENHYDRAMINE HCL 50 MG/ML IJ SOLN
25.0000 mg | Freq: Once | INTRAMUSCULAR | Status: AC
  Administered 2020-11-16: 25 mg via INTRAVENOUS
  Filled 2020-11-16: qty 1

## 2020-11-16 MED ORDER — AMLODIPINE BESYLATE 5 MG PO TABS
10.0000 mg | ORAL_TABLET | Freq: Once | ORAL | Status: AC
  Administered 2020-11-16: 10 mg via ORAL
  Filled 2020-11-16: qty 2

## 2020-11-16 MED ORDER — PREDNISONE 10 MG (21) PO TBPK: ORAL_TABLET | Freq: Every day | ORAL | 0 refills | Status: DC

## 2020-11-16 MED ORDER — METOCLOPRAMIDE HCL 5 MG/ML IJ SOLN
10.0000 mg | Freq: Once | INTRAMUSCULAR | Status: AC
  Administered 2020-11-16: 10 mg via INTRAVENOUS
  Filled 2020-11-16: qty 2

## 2020-11-16 NOTE — ED Notes (Signed)
Patient verbalizes understanding of discharge instructions. Opportunity for questioning and answers were provided. Armband removed by staff, pt discharged from ED via wheelchair.  

## 2020-11-16 NOTE — ED Notes (Signed)
Pt ambulatory to restroom

## 2020-11-16 NOTE — Consult Note (Signed)
NEUROLOGY CONSULTATION NOTE   Date of service: November 16, 2020 Patient Name: Henry Morrow MRN:  [id number] DOB:  June 08, 1975 Reason for consult: "Gaze evoked horizontal diplopia" Requesting Provider: Glynn Octave, MD _ _ _   _ __   _ __ _ _  __ __   _ __   __ _  History of Present Illness  Henry Morrow is a 45 y.o. male with PMH significant for poorly controlled HTN, Obesity, OSA who presents with headache and double vision x 5 days.  Reports diagnosed with a tonsillar abscess and given a short course of antibiotics a week ago.  About 5 days ago, he started noticing that the lower back part of his head on the right was hurting.  He describes it as a achiness that goes down into his neck.  He also reports having mild diplopia upon looking to the extreme left or to the right.  It is worse when looking to the left and he describes this diplopia as vertical double vision.  He endorses that he has had this type of headache in the past but not to this extent and has never had double vision.  This is not the worst headache of his life.  The headache is a 7-8 out of 10.  No fever, no altered mental status or confusion.  He reports that since he has been in the ED and his blood pressure was controlled, the double vision has resolved.  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  He spends extensive amount of time with his arms above his head and looking at the underside of the car for extended amount of time with his head tilted upwards.  He also reports going to a chiropractor most recently about a week ago and had neck manipulation and reports that he did have a similar pain in the back of his head when the chiropractor was using a massage gun.  He has not seen his PCP for months and has been off his blood pressure medications for months.  Workup in the ED with [facility], CTA Neck and MRI Brain and MRA Head  with no acute abnormality.    ROS   Constitutional Denies weight loss, fever and chills.   HEENT Denies changes in vision and hearing.   Respiratory Denies SOB and cough.   CV Denies palpitations and CP   GI Denies abdominal pain, nausea, vomiting and diarrhea.   GU Denies dysuria and urinary frequency.   MSK Denies myalgia and joint pain.   Skin Denies rash and pruritus.   Neurological Endorses headache but no syncope.   Psychiatric Denies recent changes in mood. Denies anxiety and depression.    Past History   Past Medical History:  Diagnosis Date   Hypertension    Obesity    Sleep apnea    Past Surgical History:  Procedure Laterality Date   HERNIA REPAIR     No family history on file. Social History   Socioeconomic History   Marital status: Married    Spouse name: Not on file   Number of children: Not on file   Years of education: Not on file   Highest education level: Not on file  Occupational History   Not on file  Tobacco Use   Smoking status: Every Day    Packs/day: 0.25    Types: Cigarettes   Smokeless tobacco: Never  Substance and Sexual Activity   Alcohol use: No   Drug use: No   Sexual activity: Not on file  Other Topics Concern   Not on file  Social History Narrative   Not on file   Social Determinants of Health   Financial Resource Strain: Not on file  Food Insecurity: Not on file  Transportation Needs: Not on file  Physical Activity: Not on file  Stress: Not on file  Social Connections: Not on file   No Known Allergies  Medications  (Not in a hospital admission)    Vitals   Vitals:   11/15/20 2230 11/15/20 2321 11/15/20 2323 11/15/20 2330  BP: (!) 164/105 (!) 162/96  (!) 153/83  Pulse: 73 74 75 74  Resp: 20 19 14 20   Temp:      TempSrc:      SpO2: 96% 95% 99% 96%  Weight:      Height:         Body mass index is 43.05 kg/m.  Physical Exam   General: Laying comfortably in bed; in no acute distress.  HENT: Normal  oropharynx and mucosa. Normal external appearance of ears and nose.  Neck: Supple, no pain or tenderness  CV: No JVD. No peripheral edema.  Pulmonary: Symmetric Chest rise. Normal respiratory effort.  Abdomen: Soft to touch, non-tender.  Ext: No cyanosis, edema, or deformity  Skin: No rash. Normal palpation of skin.   Musculoskeletal: Normal digits and nails by inspection. No clubbing.   Neurologic Examination  Mental status/Cognition: Alert, oriented to self, place, month and year, good attention.  Speech/language: Fluent, comprehension intact, object naming intact, repetition intact.  Cranial nerves:   CN II Pupils equal and reactive to light, no VF deficits    CN III,IV,VI EOM intact, no gaze preference or deviation, no nystagmus    CN V normal sensation in V1, V2, and V3 segments bilaterally    CN VII no asymmetry, no nasolabial fold flattening    CN VIII normal hearing to speech    CN IX & X normal palatal elevation, no uvular deviation    CN XI 5/5 head turn and 5/5 shoulder shrug bilaterally    CN XII midline tongue protrusion    Motor:  Muscle bulk: normal, tone normal, pronator drift none tremor none Mvmt Root Nerve  Muscle Right Left Comments  SA C5/6 Ax Deltoid 5 5   EF C5/6 Mc Biceps 5 5   EE C6/7/8 Rad Triceps 5 5   WF C6/7 Med FCR     WE C7/8 PIN ECU     F Ab C8/T1 U ADM/FDI 5 5   HF L1/2/3 Fem Illopsoas 5 5   KE L2/3/4 Fem Quad 5 5   DF L4/5 D Peron Tib Ant 5 5   PF S1/2 Tibial Grc/Sol 5 5    Reflexes:  Right Left Comments  Pectoralis      Biceps (C5/6) 2 2   Brachioradialis (C5/6) 2 2    Triceps (C6/7) 2 2    Patellar (L3/4) 2 2    Achilles (S1)      Hoffman      Plantar     Jaw jerk    Sensation:  Light touch intact   Pin prick    Temperature    Vibration   Proprioception    Coordination/Complex Motor:  - Finger to Nose intact BL - Heel to shin intact BL - Rapid alternating movement are normal - Gait: Deferred.  Labs  CBC:  Recent Labs   Lab 11/15/20 2003  WBC 8.4  HGB 13.9  HCT 41.0  MCV 91.3  PLT 202    Basic Metabolic Panel:  Lab Results  Component Value Date   NA 137 11/15/2020   K 4.0 11/15/2020   CO2 26 11/15/2020   GLUCOSE 124 (H) 11/15/2020   BUN 12 11/15/2020   CREATININE 1.24 11/15/2020   CALCIUM 9.3 11/15/2020   GFRNONAA >60 11/15/2020   GFRAA >60 11/29/2016   Lipid Panel: No results found for: LDLCALC HgbA1c: No results found for: HGBA1C Urine Drug Screen: No results found for: LABOPIA, COCAINSCRNUR, LABBENZ, AMPHETMU, THCU, LABBARB  Alcohol Level No results found for: ETH  CT Head without contrast: Personally reviewed and CTH was negative for a large hypodensity concerning for a large territory infarct or hyperdensity concerning for an ICH  CT angio Neck with contrast: Personally reviewed abd No LVO, no significant stenosis.  MR Angio head without contrast: No LVO, no significant stenosis.  MRI Brain: No acute intracranial abnormality.  Impression   Henry Morrow is a 45 y.o. male with PMH significant for HTN. OSA, obesity who presents with headache and diplopia with horizontal gaze. His neurologic examination is notable for decreased ROM in his neck with point tenderness in the R trapezius muscle and upon pressure at that point, his R occipital headache worsens. His double vision has resolved and I was unable to reproduce it on my evaluation. He has no observable CN3, 4 or 6 palsy on my evaluation.  Workup with CTH, MRI and vessel imaging with no acute abnormality.  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 I discussed with him to take a couple days off work to let his headache get better, he unfortunately, cant take time off work due to financial issues.  Recommendations  - Recommend short course of steroids, 50mg  daily x 5 days, then 40mg  x 1 day, then  30mg  x 1 day, then 20mg  x 1 day, then 10mg  x 1 day, then stop. - he reports being sleepy all day in the past on muscle relaxers and declined it - Might benefit from a topical menthol spray or cream PRN for his neck and upper back. - Recommend Physical therapy for cervicogenic headaches - Wants to establish with a new PCP, notified Dr. . - Would like to get refill on his BBP meds until he can get in with a PCP. ______________________________________________________________________   Thank you for the opportunity to take part in the care of this patient. If you have any further questions, please contact the neurology consultation attending.  Signed,  [facility] Pager Number _ _ _   _ __   _ __ _ _  __ __   _ __   __ _

## 2020-11-16 NOTE — ED Provider Notes (Signed)
Patient with 5 days of headache with double vision with looking to the left only.  No focal weakness. Reported diplopia when looking to the left only.  Pending MRA and MRI.  MRI and MRA are negative.  Patient has been seen by Dr. Derry Lory of neurology.  He favors cervicogenic headache. He recommends restarting patient's blood pressure medications, course of steroids and physical therapy and PCP follow-up.  Patient with ongoing right-sided throat pain since being seen in urgent care 1 week ago.  He was placed on antibiotics which he stopped after 3 to 4 days due to having a double vision and headache.  Discussed with Dr. Chase Picket of radiology.  No peritonsillar abscess seen on CT angiogram neck.  Clinically patient does not have any peritonsillar abscess.  Uvula is midline, controlling secretions, floor mouth is soft  Blood pressure has improved with reintroduction of home medications.  Troponin negative x2.  Patient denies any chest pain or shortness of breath. Headache is improved after Toradol, Reglan and Benadryl.  Patient tolerating p.o. and ambulatory.  Discussed importance of compliance with his blood pressure medications.  Follow-up with PCP as well as neurology. Return precautions discussed   Glynn Octave, MD 11/16/20 [phone]

## 2020-11-16 NOTE — ED Notes (Signed)
Neuro at bedside.

## 2020-11-16 NOTE — Discharge Instructions (Signed)
Your testing is negative for stroke or aneurysm.  Take your blood pressure medications as prescribed and follow-up with your primary care doctor.  Return to the ED with new or worsening symptoms.

## 2020-11-16 NOTE — ED Notes (Signed)
Dr. Manus Gunning notified of bp

## 2020-11-18 NOTE — ED Provider Notes (Signed)
.  Critical Care  Date/Time: 11/18/2020 7:59 AM Performed by: Cheryll Cockayne, MD Authorized by: Cheryll Cockayne, MD   Critical care provider statement:    Critical care time (minutes):  30   Critical care time was exclusive of:  Separately billable procedures and treating other patients and teaching time   Critical care was necessary to treat or prevent imminent or life-threatening deterioration of the following conditions:  Cardiac failure and CNS failure or compromise Comments:     Severe symptomatic hypertension requiring IV medications.    Cheryll Cockayne, MD 11/18/20 0800

## 2021-06-02 ENCOUNTER — Emergency Department (HOSPITAL_COMMUNITY)
Admission: EM | Admit: 2021-06-02 | Discharge: 2021-06-03 | Discharge status: Against Medical Advice | Payer: 59 | Attending: Emergency Medicine | Admitting: Emergency Medicine

## 2021-06-02 ENCOUNTER — Other Ambulatory Visit: Payer: Self-pay

## 2021-06-02 DIAGNOSIS — M542 Cervicalgia: Secondary | ICD-10-CM | POA: Insufficient documentation

## 2021-06-02 DIAGNOSIS — Z5321 Procedure and treatment not carried out due to patient leaving prior to being seen by health care provider: Secondary | ICD-10-CM | POA: Insufficient documentation

## 2021-06-02 DIAGNOSIS — K0889 Other specified disorders of teeth and supporting structures: Secondary | ICD-10-CM | POA: Insufficient documentation

## 2021-06-02 DIAGNOSIS — G8918 Other acute postprocedural pain: Secondary | ICD-10-CM | POA: Diagnosis not present

## 2021-06-02 DIAGNOSIS — K068 Other specified disorders of gingiva and edentulous alveolar ridge: Secondary | ICD-10-CM | POA: Diagnosis not present

## 2021-06-02 DIAGNOSIS — Z79899 Other long term (current) drug therapy: Secondary | ICD-10-CM | POA: Insufficient documentation

## 2021-06-02 HISTORY — PX: WISDOM TOOTH EXTRACTION: SHX21

## 2021-06-02 NOTE — ED Notes (Signed)
Ice pack given for comfort.

## 2021-06-02 NOTE — ED Notes (Signed)
Patient called for room with no response.  

## 2021-06-02 NOTE — ED Triage Notes (Signed)
Pt was sent here by dentist for continued bleeding after having a tooth pulled today around 1500. Pt was given nitrous oxide and states he feels weird, c/o pain in his neck and saying it hurts to swallow. Pt is spitting up blood. ?

## 2021-06-02 NOTE — ED Provider Triage Note (Signed)
Emergency Medicine Provider Triage Evaluation Note ? ?Reynolds , a 46 y.o. male  was evaluated in triage.  Pt sent by dentist at 1500 for continued bleeding after tooth extraction (wisdom tooth on left side).  Pain with swallowing.  Still bleeding, has gauze in mouth to help.  Not on anticoagulants. ? ?Review of Systems  ?Positive: Dental pain ?Negative: SOB ? ?Physical Exam  ?BP (!) 185/114   Pulse 93   Temp 98 ?F (36.7 ?C)   Resp 16   SpO2 98%  ?Gen:   Awake, no distress   ?Resp:  Normal effort  ?MSK:   Moves extremities without difficulty  ?Other:  Saliva with small amounts of blood present in oropharynx, uvula midline, mouth patent and clear ? ?Medical Decision Making  ?Medically screening exam initiated at 7:02 PM.  Appropriate orders placed.  Gregary Saintjames Maish was informed that the remainder of the evaluation will be completed by another provider, this initial triage assessment does not replace that evaluation, and the importance of remaining in the ED until their evaluation is complete. ? ?  ?Prince Rome, PA-C ?[id number] ? ?

## 2021-06-02 NOTE — ED Notes (Signed)
Pt family states he is having trouble swallowing from a tooth. Nurse is aware.  ?

## 2021-06-03 ENCOUNTER — Encounter (HOSPITAL_COMMUNITY): Payer: Self-pay

## 2021-06-03 ENCOUNTER — Other Ambulatory Visit: Payer: Self-pay

## 2021-06-03 ENCOUNTER — Emergency Department (HOSPITAL_COMMUNITY)
Admission: EM | Admit: 2021-06-03 | Discharge: 2021-06-03 | Disposition: A | Discharge status: Home / Self Care | Payer: 59 | Source: Home / Self Care | Attending: Emergency Medicine | Admitting: Emergency Medicine

## 2021-06-03 DIAGNOSIS — Z79899 Other long term (current) drug therapy: Secondary | ICD-10-CM | POA: Insufficient documentation

## 2021-06-03 DIAGNOSIS — G8918 Other acute postprocedural pain: Secondary | ICD-10-CM | POA: Insufficient documentation

## 2021-06-03 DIAGNOSIS — K068 Other specified disorders of gingiva and edentulous alveolar ridge: Secondary | ICD-10-CM | POA: Insufficient documentation

## 2021-06-03 DIAGNOSIS — R58 Hemorrhage, not elsewhere classified: Secondary | ICD-10-CM

## 2021-06-03 MED ORDER — OXYCODONE-ACETAMINOPHEN 5-325 MG PO TABS
1.0000 | ORAL_TABLET | Freq: Once | ORAL | Status: AC
  Administered 2021-06-03: 1 via ORAL
  Filled 2021-06-03: qty 1

## 2021-06-03 MED ORDER — TRANEXAMIC ACID FOR EPISTAXIS
500.0000 mg | Freq: Once | TOPICAL | Status: AC
  Administered 2021-06-03: 500 mg via TOPICAL
  Filled 2021-06-03: qty 10

## 2021-06-03 NOTE — ED Triage Notes (Signed)
Pt BIB [facility] from home after having his wisdom teeth removed yesterday. Pt states that he has had pain and bleeding for about the last 45 minutes. Large clot hanging from left lower side of mouth. Pt spitting blood.  ?

## 2021-06-03 NOTE — Discharge Instructions (Signed)
Please follow-up with your dentist tomorrow as well as with the oral surgeon tomorrow.  Contact their numbers first thing in the morning to request same-day appointment if possible. ? ?If you have recurrent bleeding, come back to ER as needed. ?

## 2021-06-03 NOTE — ED Provider Notes (Addendum)
?[facility] EMERGENCY DEPARTMENT ?Provider Note ? ? ?CSN: [id number] ?Arrival date & time: 06/03/21  1554 ? ?  ? ?History ? ?Chief Complaint  ?Patient presents with  ? Post-op Problem  ? ? ?Henry Morrow is a 46 y.o. male.  Presents to ER with concern for bleeding.  Patient states that he had a wisdom tooth removed yesterday at a dentistry office called urgent [facility] in [place].  He said that had some bleeding postprocedure and had gone back to the dentist and the bleeding had stopped.  Then the bleeding started again and when he called he was advised to go to the ER.  States that while he was in the emergency room yesterday evening the bleeding eventually stopped and he went home.  Slept fine went through most of the day today without any issues and then this afternoon the site started to bleed again.  Has noted steady ooze of blood ever since for the past hour.  Denies feeling lightheaded, no episodes of passing out.  Denies prior issues of easy bleeding, no bleeding disorders, not on blood thinners. ? ? ? ?HPI ? ?  ? ?Home Medications ?Prior to Admission medications   ?Medication Sig Start Date End Date Taking? Authorizing Provider  ?amLODipine (NORVASC) 5 MG tablet Take 1 tablet (5 mg total) by mouth daily. 11/16/20   Rancour, Jeannett Senior, MD  ?ciprofloxacin (CIPRO) 500 MG tablet Take 1 tablet (500 mg total) by mouth 2 (two) times daily. ?Patient not taking: Reported on 07/05/2018 02/08/15   Linna Hoff, MD  ?fenofibrate (TRICOR) 145 MG tablet Take 145 mg by mouth daily.    [provider]  ?hydrochlorothiazide (HYDRODIURIL) 25 MG tablet Take 1 tablet (25 mg total) by mouth daily. 11/16/20   Rancour, Jeannett Senior, MD  ?ibuprofen (ADVIL) 800 MG tablet Take 1 tablet (800 mg total) by mouth 3 (three) times daily. 11/07/20   Ivette Loyal, NP  ?lisinopril (PRINIVIL,ZESTRIL) 40 MG tablet Take 40 mg by mouth daily.    [provider]  ?niacin 100 MG tablet Take 100 mg by mouth at  bedtime.    [provider]  ?predniSONE (STERAPRED UNI-PAK 21 TAB) 10 MG (21) TBPK tablet Take by mouth daily. Take 6 tabs by mouth daily  for 2 days, then 5 tabs for 2 days, then 4 tabs for 2 days, then 3 tabs for 2 days, 2 tabs for 2 days, then 1 tab by mouth daily for 2 days 11/16/20   Glynn Octave, MD  ?traMADol (ULTRAM) 50 MG tablet Take 1 tablet (50 mg total) by mouth every 6 (six) hours as needed. ?Patient not taking: Reported on 07/05/2018 09/16/15   Santiago Glad, PA-C  ?   ? ?Allergies    ?Patient has no known allergies.   ? ?Review of Systems   ?Review of Systems  ?HENT:  Positive for dental problem.   ?All other systems reviewed and are negative. ? ?Physical Exam ?Updated Vital Signs ?BP (!) 163/101 (BP Location: Right Arm)   Pulse (!) 108   Resp 19   Ht 5\' 10"  (1.778 m)   Wt 136.1 kg   SpO2 97%   BMI 43.05 kg/m?  ?Physical Exam ?Vitals and nursing note reviewed.  ?Constitutional:   ?   General: He is not in acute distress. ?   Appearance: He is well-developed.  ?HENT:  ?   Head: Normocephalic.  ?   Mouth/Throat:  ?   Comments: Left lower molar region there  is a tooth extraction site that appears intact, no large active bleeding noted, there were clots of blood present in mouth ?Eyes:  ?   Conjunctiva/sclera: Conjunctivae normal.  ?Cardiovascular:  ?   Rate and Rhythm: Normal rate.  ?   Pulses: Normal pulses.  ?Pulmonary:  ?   Effort: Pulmonary effort is normal. No respiratory distress.  ?Abdominal:  ?   Palpations: Abdomen is soft.  ?   Tenderness: There is no abdominal tenderness.  ?Musculoskeletal:     ?   General: No swelling.  ?   Cervical back: Neck supple.  ?Skin: ?   General: Skin is warm and dry.  ?   Capillary Refill: Capillary refill takes less than 2 seconds.  ?Neurological:  ?   General: No focal deficit present.  ?   Mental Status: He is alert.  ?Psychiatric:     ?   Mood and Affect: Mood normal.  ? ? ?ED Results / Procedures / Treatments   ?Labs ?(all labs ordered are  listed, but only abnormal results are displayed) ?Labs Reviewed - No data to display ? ?EKG ?None ? ?Radiology ?No results found. ? ?Procedures ?Procedures  ? ? ?Medications Ordered in ED ?Medications  ?tranexamic acid (CYKLOKAPRON) 1000 MG/10ML topical solution 500 mg (500 mg Topical Given by Other 06/03/21 1654)  ? ? ?ED Course/ Medical Decision Making/ A&P ?  ?                        ?Medical Decision Making ? ?46 year old gentleman presented to ER with concern for bleeding from dental tooth extraction site.  An original procedure was yesterday.  When I evaluated patient in ER, he was spitting up some small amount of blood.  After clots were cleared out of mouth, the extraction site appeared to be intact, with at most a very mild venous ooze. Soaked a cotton ball in ~51mL solution of TXA diluted with NS 1:1 ratio and then applied the cotton ball soaked with txa directly to the extraction site. Rechecked patient in ~20 minutes and removed the gauze and cotton ball. There is no ongoing bleeding noted. Observed for another 20 minutes. No recurrent bleeding noted.  Advised patient to follow-up with his dentist as well as with oral surgery tomorrow. ? ?When patient first arrived, I had attempted to contact both the oral surgeon on-call during the day but it was after 4pm, Dr. Chales Salmon as well as the dentist on-call, Dr. Mia Creek but neither of these consultants called back. Given that his bleeding has stopped with these measures, feel he is stable for dc and out pt f/u.  ? ? ? ? ? ? ? ? ? ?Final Clinical Impression(s) / ED Diagnoses ?Final diagnoses:  ?Post-op pain  ?Bleeding  ? ? ?Rx / DC Orders ?ED Discharge Orders   ? ? None  ? ?  ? ? ?  ?Milagros Loll, MD ?06/03/21 1738 ? ?  ?Milagros Loll, MD ?06/03/21 1738 ? ?

## 2021-06-04 ENCOUNTER — Encounter (HOSPITAL_COMMUNITY): Payer: Self-pay

## 2021-06-04 ENCOUNTER — Emergency Department (HOSPITAL_COMMUNITY)
Admission: EM | Admit: 2021-06-04 | Discharge: 2021-06-04 | Disposition: A | Discharge status: Home / Self Care | Payer: 59 | Attending: Emergency Medicine | Admitting: Emergency Medicine

## 2021-06-04 DIAGNOSIS — I10 Essential (primary) hypertension: Secondary | ICD-10-CM | POA: Insufficient documentation

## 2021-06-04 DIAGNOSIS — R791 Abnormal coagulation profile: Secondary | ICD-10-CM | POA: Insufficient documentation

## 2021-06-04 DIAGNOSIS — Z79899 Other long term (current) drug therapy: Secondary | ICD-10-CM | POA: Diagnosis not present

## 2021-06-04 DIAGNOSIS — D649 Anemia, unspecified: Secondary | ICD-10-CM | POA: Diagnosis not present

## 2021-06-04 DIAGNOSIS — K068 Other specified disorders of gingiva and edentulous alveolar ridge: Secondary | ICD-10-CM | POA: Diagnosis present

## 2021-06-04 DIAGNOSIS — K91840 Postprocedural hemorrhage and hematoma of a digestive system organ or structure following a digestive system procedure: Secondary | ICD-10-CM

## 2021-06-04 LAB — CBC
HCT: 39.1 % (ref 39.0–52.0)
HCT: 32 % — ABNORMAL LOW (ref 39.0–52.0)
Hemoglobin: 13.1 g/dL (ref 13.0–17.0)
Hemoglobin: 10.9 g/dL — ABNORMAL LOW (ref 13.0–17.0)
MCH: 30.9 pg (ref 26.0–34.0)
MCH: 31.4 pg (ref 26.0–34.0)
MCHC: 33.5 g/dL (ref 30.0–36.0)
MCHC: 34.1 g/dL (ref 30.0–36.0)
MCV: 92.2 fL (ref 80.0–100.0)
MCV: 92.2 fL (ref 80.0–100.0)
Platelets: 236 10*3/uL (ref 150–400)
Platelets: 187 10*3/uL (ref 150–400)
RBC: 4.24 MIL/uL (ref 4.22–5.81)
RBC: 3.47 MIL/uL — ABNORMAL LOW (ref 4.22–5.81)
RDW: 12.8 % (ref 11.5–15.5)
RDW: 12.9 % (ref 11.5–15.5)
WBC: 14.2 10*3/uL — ABNORMAL HIGH (ref 4.0–10.5)
WBC: 12.7 10*3/uL — ABNORMAL HIGH (ref 4.0–10.5)
nRBC: 0 % (ref 0.0–0.2)
nRBC: 0 % (ref 0.0–0.2)

## 2021-06-04 LAB — PROTIME-INR
INR: 0.9 (ref 0.8–1.2)
Prothrombin Time: 12.6 seconds (ref 11.4–15.2)

## 2021-06-04 MED ORDER — LIDOCAINE-EPINEPHRINE (PF) 2 %-1:200000 IJ SOLN
20.0000 mL | Freq: Once | INTRAMUSCULAR | Status: AC
  Administered 2021-06-04: 20 mL
  Filled 2021-06-04: qty 20

## 2021-06-04 MED ORDER — FENTANYL CITRATE PF 50 MCG/ML IJ SOSY
50.0000 ug | PREFILLED_SYRINGE | Freq: Once | INTRAMUSCULAR | Status: DC
  Filled 2021-06-04: qty 1

## 2021-06-04 MED ORDER — TRANEXAMIC ACID FOR EPISTAXIS
500.0000 mg | Freq: Once | TOPICAL | Status: AC
Start: 2021-06-04 — End: 2021-06-04
  Administered 2021-06-04: 500 mg via TOPICAL
  Filled 2021-06-04: qty 10

## 2021-06-04 MED ORDER — FENTANYL CITRATE PF 50 MCG/ML IJ SOSY
100.0000 ug | PREFILLED_SYRINGE | Freq: Once | INTRAMUSCULAR | Status: AC
  Administered 2021-06-04: 100 ug via INTRAVENOUS
  Filled 2021-06-04: qty 2

## 2021-06-04 MED ORDER — FENTANYL CITRATE PF 50 MCG/ML IJ SOSY
50.0000 ug | PREFILLED_SYRINGE | Freq: Once | INTRAMUSCULAR | Status: AC
  Administered 2021-06-04: 50 ug via INTRAVENOUS
  Filled 2021-06-04: qty 1

## 2021-06-04 NOTE — ED Provider Notes (Signed)
?[facility] ?Provider Note ? ? ?CSN: [id number] ?Arrival date & time: 06/04/21  0309 ? ?  ? ?History ? ?Chief Complaint  ?Patient presents with  ? Dental Problem  ?Level 5 caveat due to acuity of condition ? ?Henry Morrow is a 46 y.o. male. ? ?The history is provided by the patient. The history is limited by the condition of the patient.  ?Dental Pain ?Location:  Lower ?Timing:  Constant ?Progression:  Worsening ?Chronicity:  Recurrent ?Previous work-up:  Dental exam (Wisdom tooth extraction) ?Relieved by:  Nothing ?Patient w/history of hypertension presents with oral bleeding ?Patient had left lower wisdom tooth extraction on March 2. ?He has had intermittent bleeding since that time ?He does not take anticoagulation ?  ? ?Home Medications ?Prior to Admission medications   ?Medication Sig Start Date End Date Taking? Authorizing Provider  ?amLODipine (NORVASC) 5 MG tablet Take 1 tablet (5 mg total) by mouth daily. 11/16/20   Rancour, Annie Main, MD  ?fenofibrate (TRICOR) 145 MG tablet Take 145 mg by mouth daily.    [provider]  ?hydrochlorothiazide (HYDRODIURIL) 25 MG tablet Take 1 tablet (25 mg total) by mouth daily. 11/16/20   Rancour, Annie Main, MD  ?lisinopril (PRINIVIL,ZESTRIL) 40 MG tablet Take 40 mg by mouth daily.    [provider]  ?niacin 100 MG tablet Take 100 mg by mouth at bedtime.    [provider]  ?   ? ?Allergies    ?Patient has no known allergies.   ? ?Review of Systems   ?Review of Systems  ?Unable to perform ROS: Acuity of condition  ? ?Physical Exam ?Updated Vital Signs ?BP (!) 168/100   Pulse (!) 107   Temp (!) 97 ?F (36.1 ?C) (Tympanic)   Resp 16   SpO2 93%  ?Physical Exam ?CONSTITUTIONAL: Well developed/well nourished, holding a trash can and spitting up blood ?HEAD: Normocephalic/atraumatic ?EYES: EOMI/PERRL ?ENMT: Mucous membranes moist, clot formation noted to the left lower extraction site. No active bleeding,  uvula midline, no stridor, no drooling ?NECK: supple no meningeal signs ?CV: tachycardic ?LUNGS:no apparent distress ?ABDOMEN: soft, obese ?NEURO: Pt is awake/alert/appropriate, moves all extremitiesx4.  No facial droop.   ?EXTREMITIES: pulses normal/equal, full ROM ?SKIN: diaphoretic ?PSYCH: anxious ? ?ED Results / Procedures / Treatments   ?Labs ?(all labs ordered are listed, but only abnormal results are displayed) ?Labs Reviewed  ?CBC - Abnormal; Notable for the following components:  ?    Result Value  ? WBC 14.2 (*)   ? All other components within normal limits  ?CBC - Abnormal; Notable for the following components:  ? WBC 12.7 (*)   ? RBC 3.47 (*)   ? Hemoglobin 10.9 (*)   ? HCT 32.0 (*)   ? All other components within normal limits  ?PROTIME-INR  ? ? ?EKG ?None ? ?Radiology ?No results found. ? ?Procedures ?Mar[facility] KitchenCritical Care ?Performed by: Ripley Fraise, MD ?Authorized by: Ripley Fraise, MD  ? ?Critical care provider statement:  ?  Critical care time (minutes):  60 ?  Critical care start time:  06/04/2021 5:00 AM ?  Critical care end time:  06/04/2021 6:00 AM ?  Critical care was necessary to treat or prevent imminent or life-threatening deterioration of the following conditions:  Circulatory failure ?  Critical care was time spent personally by me on the following activities:  Development of treatment plan with patient or surrogate, examination of patient, re-evaluation of patient's condition, ordering and review of laboratory studies and  ordering and performing treatments and interventions ?  I assumed direction of critical care for this patient from another provider in my specialty: no    ? ? ?Medications Ordered in ED ?Medications - No data to display ? ?ED Course/ Medical Decision Making/ A&P ?Clinical Course as of 06/04/21 0747  ?Sat Jun 04, 2021  ?[id number] Patient presents for third ER visit for postextraction bleeding.  He has already received TXA to the socket without improvement.  I have him biting down  on a teabag at this time, will reassess [DW]  ?0413 Bleeding did not stop with pressure and teabag.  I then injected the area with lidocaine with epinephrine, the bleeding did not slow down.  I could not visualize any area of the gingiva that I could repair with sutures.  Still awaiting callback from dentistry.  Patient is now holding pressure to the area [DW]  ?[phone] On second attempt, I placed gauze that was soaked with TXA and normal saline (93ml) placed directly on the bleeding gingiva.  I also suctioned around the area. [DW]  ?[id number] Attempted to call directly to the dentist on-call on personal cell phone, but no answer [DW]  ?0505 Patient had a vagal episode his blood pressure is now in the 80s.  He was placed in Trendelenburg and given IV fluids patient is responsive.  Fortunately his bleeding is now improving.  We will also recheck CBC as he is having  blood loss from the extraction site [DW]  ?0526 Blood pressure is improving [DW]  ?0558 Hemoglobin(!): 10.9 ?Patient had an acute drop in his hemoglobin.  However at this time his bleeding has stopped.  We will continue to monitor.  Still awaiting callback from dentistry [DW]  ?0729 After 7 AM, oral surgery has been paged.  Patient stable at this time [DW]  ?0746 Pt stable, no further bleeding.  Signed out to Dr. Ashok Cordia at shift change with oral surgery consult pending ? [DW]  ?  ?Clinical Course User Index ?[DW] Ripley Fraise, MD  ? ?                        ?Medical Decision Making ?Amount and/or Complexity of Data Reviewed ?Labs: ordered. Decision-making details documented in ED Course. ? ?Risk ?Prescription drug management. ? ? ?This patient presents to the ED for concern of dental bleeding, this involves an extensive number of treatment options, and is a complaint that carries with it a high risk of complications and morbidity.  The differential diagnosis includes dental abscess, post extraction bleeding, coagulopathy  ? ?Comorbidities that complicate the  patient evaluation: ?Patient?s presentation is complicated by their history of hypertension ? ? ?Additional history obtained: ?Additional history obtained from spouse ?Records reviewed Primary Care Documents ? ?Lab Tests: ?I Ordered, and personally interpreted labs.  The pertinent results include:  leukocytosis ? Repeat labs - acute anemia ? ?Cardiac Monitoring: ?The patient was maintained on a cardiac monitor.  I personally viewed and interpreted the cardiac monitor which showed an underlying rhythm of:  sinus tachycardia ? ?Medicines ordered and prescription drug management: ?I ordered medication including IV fentanyl  for pain  ?Reevaluation of the patient after these medicines showed that the patient    improved ? ? ?Critical Interventions: ? ?dental procedures to stop post extraction bleeding ? ?Consultations Obtained: ?Consulted oral surgery and dentistry, no answer ? ?Reevaluation: ?After the interventions noted above, I reevaluated the patient and found that they have :improved ? ?Complexity of  problems addressed: ?Patient?s presentation is most consistent with  acute presentation with potential threat to life or bodily function ? ?  ? ? ? ? ? ? ? ?Final Clinical Impression(s) / ED Diagnoses ?Final diagnoses:  ?Gingival bleeding  ?Acute anemia  ? ? ?Rx / DC Orders ?ED Discharge Orders   ? ? None  ? ?  ? ? ?  ?Ripley Fraise, MD ?06/04/21 [phone] ? ?

## 2021-06-04 NOTE — ED Provider Notes (Signed)
Signed out at 0800 that call back from oral surgery pending. Pt with 3rd ED visit s/p dental extraction/bleeding, and had 3 gm decrease in hgb.  (Pt relays hx that initially went to [facility] towards the end of February for extraction of impacted?infected left lower molar - he indicates they were not able to extract, there was some bleeding, and indicates two stitches were placed. Indicates he subsequently went to [facility] 3[facility], and the tooth extraction, had persistent bleeding post extraction that day, went back to that site that day and was told dentist had left and to go to ER - two subsequent ED visits before last night).   ? ?Dr Chales Salmon call back at ~ 0850, pt, amount of bleeding, labs, vitals discussed. He indicates he will see patient this AM, indicates feels given equipment, staff, etc, he can best manage patient definitively at his office, indicates will meet patient there in ~ 1 hr.  ? ?Discussed plan w pt. On recheck, there is clot to area, no active bleeding. Currently bp is 136/92, hr is 92, pulse ox 99%.  ? ?Pt currently appears stable for transport to Dr Frederik Pear office. Charge RN contacted to arrange transport.  Family can meet/stay with patient at office and get patient home post visit.  ? ? ?  ?Cathren Laine, MD ?06/04/21 [phone] ? ?

## 2021-06-04 NOTE — ED Triage Notes (Signed)
Pt had wisdom teeth removed yesterday and has been having bleeding since, seen last yesterday for the same.  ?

## 2021-06-04 NOTE — ED Notes (Signed)
RN reviewed discharge instructions w/ pt and his wife. Pt to be taken to Dr Frederik Pear office by [employer] and  wife to meet him there in POV. ?

## 2021-06-04 NOTE — Discharge Instructions (Addendum)
It was our pleasure to provide your ER care today - we hope that you feel better. ? ?Go directly to Dr Frederik Pear office now.  Do not eat/drink prior to arriving there.  ? ?If bleeding recurs, press down firmly with gauze directly on the bleeding area for 10 minutes (without letting up), if bleeding persists, return immediately. ? ?Return to ER if worse, new symptoms, chest pain, trouble breathing, fainting, or other concern.  ? ?Follow up with primary care doctor regarding your high blood pressure in the next couple weeks.  ?

## 2021-06-19 ENCOUNTER — Other Ambulatory Visit: Payer: Self-pay

## 2021-06-19 ENCOUNTER — Observation Stay (HOSPITAL_COMMUNITY)
Admission: EM | Admit: 2021-06-19 | Discharge: 2021-06-20 | Disposition: A | Discharge status: Home / Self Care | Payer: 59 | Attending: Internal Medicine | Admitting: Internal Medicine

## 2021-06-19 ENCOUNTER — Encounter (HOSPITAL_COMMUNITY): Payer: Self-pay

## 2021-06-19 DIAGNOSIS — I10 Essential (primary) hypertension: Secondary | ICD-10-CM | POA: Diagnosis not present

## 2021-06-19 DIAGNOSIS — D649 Anemia, unspecified: Secondary | ICD-10-CM

## 2021-06-19 DIAGNOSIS — K068 Other specified disorders of gingiva and edentulous alveolar ridge: Secondary | ICD-10-CM

## 2021-06-19 DIAGNOSIS — Z79899 Other long term (current) drug therapy: Secondary | ICD-10-CM | POA: Diagnosis not present

## 2021-06-19 DIAGNOSIS — I959 Hypotension, unspecified: Secondary | ICD-10-CM

## 2021-06-19 DIAGNOSIS — Y848 Other medical procedures as the cause of abnormal reaction of the patient, or of later complication, without mention of misadventure at the time of the procedure: Secondary | ICD-10-CM | POA: Insufficient documentation

## 2021-06-19 DIAGNOSIS — D62 Acute posthemorrhagic anemia: Principal | ICD-10-CM

## 2021-06-19 DIAGNOSIS — F17210 Nicotine dependence, cigarettes, uncomplicated: Secondary | ICD-10-CM | POA: Insufficient documentation

## 2021-06-19 DIAGNOSIS — K91840 Postprocedural hemorrhage and hematoma of a digestive system organ or structure following a digestive system procedure: Secondary | ICD-10-CM | POA: Insufficient documentation

## 2021-06-19 DIAGNOSIS — R55 Syncope and collapse: Secondary | ICD-10-CM

## 2021-06-19 LAB — CBC WITH DIFFERENTIAL/PLATELET
Abs Immature Granulocytes: 0.03 10*3/uL (ref 0.00–0.07)
Abs Immature Granulocytes: 0.03 10*3/uL (ref 0.00–0.07)
Abs Immature Granulocytes: 0.03 10*3/uL (ref 0.00–0.07)
Basophils Absolute: 0 10*3/uL (ref 0.0–0.1)
Basophils Absolute: 0 10*3/uL (ref 0.0–0.1)
Basophils Absolute: 0 10*3/uL (ref 0.0–0.1)
Basophils Relative: 0 %
Basophils Relative: 0 %
Basophils Relative: 0 %
Eosinophils Absolute: 0.1 10*3/uL (ref 0.0–0.5)
Eosinophils Absolute: 0.1 10*3/uL (ref 0.0–0.5)
Eosinophils Absolute: 0.1 10*3/uL (ref 0.0–0.5)
Eosinophils Relative: 2 %
Eosinophils Relative: 2 %
Eosinophils Relative: 1 %
HCT: 31.3 % — ABNORMAL LOW (ref 39.0–52.0)
HCT: 25.7 % — ABNORMAL LOW (ref 39.0–52.0)
HCT: 25.4 % — ABNORMAL LOW (ref 39.0–52.0)
Hemoglobin: 10.9 g/dL — ABNORMAL LOW (ref 13.0–17.0)
Hemoglobin: 8.6 g/dL — ABNORMAL LOW (ref 13.0–17.0)
Hemoglobin: 8.4 g/dL — ABNORMAL LOW (ref 13.0–17.0)
Immature Granulocytes: 1 %
Immature Granulocytes: 1 %
Immature Granulocytes: 1 %
Lymphocytes Relative: 26 %
Lymphocytes Relative: 19 %
Lymphocytes Relative: 24 %
Lymphs Abs: 1.7 10*3/uL (ref 0.7–4.0)
Lymphs Abs: 1.3 10*3/uL (ref 0.7–4.0)
Lymphs Abs: 1.6 10*3/uL (ref 0.7–4.0)
MCH: 33.3 pg (ref 26.0–34.0)
MCH: 32.7 pg (ref 26.0–34.0)
MCH: 32.1 pg (ref 26.0–34.0)
MCHC: 34.8 g/dL (ref 30.0–36.0)
MCHC: 33.5 g/dL (ref 30.0–36.0)
MCHC: 33.1 g/dL (ref 30.0–36.0)
MCV: 95.7 fL (ref 80.0–100.0)
MCV: 97.7 fL (ref 80.0–100.0)
MCV: 96.9 fL (ref 80.0–100.0)
Monocytes Absolute: 0.4 10*3/uL (ref 0.1–1.0)
Monocytes Absolute: 0.5 10*3/uL (ref 0.1–1.0)
Monocytes Absolute: 0.4 10*3/uL (ref 0.1–1.0)
Monocytes Relative: 7 %
Monocytes Relative: 8 %
Monocytes Relative: 7 %
Neutro Abs: 4.2 10*3/uL (ref 1.7–7.7)
Neutro Abs: 4.6 10*3/uL (ref 1.7–7.7)
Neutro Abs: 4.2 10*3/uL (ref 1.7–7.7)
Neutrophils Relative %: 64 %
Neutrophils Relative %: 70 %
Neutrophils Relative %: 67 %
Platelets: 228 10*3/uL (ref 150–400)
Platelets: 190 10*3/uL (ref 150–400)
Platelets: 189 10*3/uL (ref 150–400)
RBC: 3.27 MIL/uL — ABNORMAL LOW (ref 4.22–5.81)
RBC: 2.63 MIL/uL — ABNORMAL LOW (ref 4.22–5.81)
RBC: 2.62 MIL/uL — ABNORMAL LOW (ref 4.22–5.81)
RDW: 15.7 % — ABNORMAL HIGH (ref 11.5–15.5)
RDW: 16.1 % — ABNORMAL HIGH (ref 11.5–15.5)
RDW: 16 % — ABNORMAL HIGH (ref 11.5–15.5)
WBC: 6.5 10*3/uL (ref 4.0–10.5)
WBC: 6.6 10*3/uL (ref 4.0–10.5)
WBC: 6.4 10*3/uL (ref 4.0–10.5)
nRBC: 0 % (ref 0.0–0.2)
nRBC: 0 % (ref 0.0–0.2)
nRBC: 0 % (ref 0.0–0.2)

## 2021-06-19 LAB — HEMOGLOBIN AND HEMATOCRIT, BLOOD
HCT: 26 % — ABNORMAL LOW (ref 39.0–52.0)
Hemoglobin: 8.4 g/dL — ABNORMAL LOW (ref 13.0–17.0)

## 2021-06-19 LAB — RETICULOCYTES
Immature Retic Fract: 27.8 % — ABNORMAL HIGH (ref 2.3–15.9)
RBC.: 2.6 MIL/uL — ABNORMAL LOW (ref 4.22–5.81)
Retic Count, Absolute: 203.1 10*3/uL — ABNORMAL HIGH (ref 19.0–186.0)
Retic Ct Pct: 7.8 % — ABNORMAL HIGH (ref 0.4–3.1)

## 2021-06-19 LAB — FERRITIN: Ferritin: 146 ng/mL (ref 24–336)

## 2021-06-19 LAB — TYPE AND SCREEN
ABO/RH(D): O POS
Antibody Screen: NEGATIVE

## 2021-06-19 LAB — BASIC METABOLIC PANEL
Anion gap: 9 (ref 5–15)
BUN: 14 mg/dL (ref 6–20)
CO2: 23 mmol/L (ref 22–32)
Calcium: 9.3 mg/dL (ref 8.9–10.3)
Chloride: 106 mmol/L (ref 98–111)
Creatinine, Ser: 1.28 mg/dL — ABNORMAL HIGH (ref 0.61–1.24)
GFR, Estimated: >60 mL/min (ref >60)
Glucose, Bld: 124 mg/dL — ABNORMAL HIGH (ref 70–99)
Potassium: 4.1 mmol/L (ref 3.5–5.1)
Sodium: 138 mmol/L (ref 135–145)

## 2021-06-19 LAB — PROTIME-INR
INR: 0.9 (ref 0.8–1.2)
Prothrombin Time: 12.6 seconds (ref 11.4–15.2)

## 2021-06-19 LAB — HIV ANTIBODY (ROUTINE TESTING W REFLEX): HIV Screen 4th Generation wRfx: NONREACTIVE

## 2021-06-19 LAB — IRON AND TIBC
Iron: 70 ug/dL (ref 45–182)
Saturation Ratios: 19 % (ref 17.9–39.5)
TIBC: 363 ug/dL (ref 250–450)
UIBC: 293 ug/dL

## 2021-06-19 LAB — ABO/RH: ABO/RH(D): O POS

## 2021-06-19 LAB — APTT: aPTT: 33 seconds (ref 24–36)

## 2021-06-19 MED ORDER — ACETAMINOPHEN 650 MG RE SUPP: 650.0000 mg | Freq: Four times a day (QID) | RECTAL | Status: DC | PRN

## 2021-06-19 MED ORDER — TRANEXAMIC ACID-NACL 1000-0.7 MG/100ML-% IV SOLN
1000.0000 mg | INTRAVENOUS | Status: AC
  Administered 2021-06-19: 1000 mg via INTRAVENOUS
  Filled 2021-06-19: qty 100

## 2021-06-19 MED ORDER — BIOTENE DRY MOUTH MT LIQD
15.0000 mL | OROMUCOSAL | Status: DC | PRN
Start: 2021-06-19 — End: 2021-06-20

## 2021-06-19 MED ORDER — ACETAMINOPHEN 325 MG PO TABS: 650.0000 mg | ORAL_TABLET | Freq: Four times a day (QID) | ORAL | Status: DC | PRN

## 2021-06-19 MED ORDER — MORPHINE SULFATE (PF) 4 MG/ML IV SOLN
4.0000 mg | Freq: Once | INTRAVENOUS | Status: AC
  Administered 2021-06-19: 4 mg via INTRAVENOUS
  Filled 2021-06-19: qty 1

## 2021-06-19 MED ORDER — "THROMBI-PAD 3""X3"" EX PADS"
1.0000 | MEDICATED_PAD | Freq: Once | CUTANEOUS | Status: AC
  Administered 2021-06-19: 1 via TOPICAL
  Filled 2021-06-19: qty 1

## 2021-06-19 NOTE — Progress Notes (Signed)
Patient wearing home CPAP when RT entered room. RT will monitor as needed. ?

## 2021-06-19 NOTE — ED Notes (Addendum)
Patient's wife called out stating patient usually wears cpap at home and is falling asleep. 2L [place] applied for sleep.  ?

## 2021-06-19 NOTE — ED Notes (Signed)
Admitting doctor at bedside 

## 2021-06-19 NOTE — ED Triage Notes (Signed)
Pt had wisdom teeth pulled and is now bleeding profusely, with clots, said that it did this the same day that he had them pulled. Is worried about going into shock.  ?

## 2021-06-19 NOTE — ED Provider Notes (Signed)
?[facility] EMERGENCY DEPARTMENT ?Provider Note ? ? ?CSN: [id number] ?Arrival date & time: 06/19/21  0002 ? ?  ? ?History ? ?Chief Complaint  ?Patient presents with  ? Dental Injury  ? ? ?Henry Morrow is a 46 y.o. male. ? ?The history is provided by the patient.  ?Dental Injury ?He has history of hypertension and comes in with bleeding from site of a left lower wisdom tooth extraction.  Extraction was on March 2, and he had ED visits on March 2, March 3, March 4 because of post extraction bleeding.  Eventually, he was seen by Dr. Chales Salmon, oral surgeon, who was able to get the bleeding controlled.  He had no problems until tonight when it started bleeding spontaneously again.  He denies any trauma and denies taking any aspirin or any anticoagulants.  He did take a dose of ibuprofen. ? ?  ?Home Medications ?Prior to Admission medications   ?Medication Sig Start Date End Date Taking? Authorizing Provider  ?acetaminophen (TYLENOL) 500 MG tablet Take 1,000 mg by mouth every 6 (six) hours as needed for moderate pain or headache.    [provider]  ?amLODipine (NORVASC) 10 MG tablet Take 10 mg by mouth daily. 04/15/21   [provider]  ?amLODipine (NORVASC) 5 MG tablet Take 1 tablet (5 mg total) by mouth daily. ?Patient not taking: Reported on 06/04/2021 11/16/20   Glynn Octave, MD  ?amoxicillin (AMOXIL) 500 MG capsule Take 500 mg by mouth See admin instructions. Tid x 7days 06/02/21   [provider]  ?buPROPion (WELLBUTRIN XL) 300 MG 24 hr tablet Take 300 mg by mouth daily. 05/30/21   [provider]  ?chlorhexidine (PERIDEX) 0.12 % solution 15 mLs by Mouth Rinse route 2 (two) times daily. 06/02/21   [provider]  ?hydrochlorothiazide (HYDRODIURIL) 25 MG tablet Take 1 tablet (25 mg total) by mouth daily. 11/16/20   Rancour, Jeannett Senior, MD  ?HYDROcodone-acetaminophen (NORCO/VICODIN) 5-325 MG tablet Take 1 tablet by mouth 3 (three) times daily as needed  for moderate pain. 05/26/21   [provider]  ?ibuprofen (ADVIL) 800 MG tablet Take 800 mg by mouth every 8 (eight) hours as needed for headache or moderate pain.    [provider]  ?methylPREDNISolone (MEDROL DOSEPAK) 4 MG TBPK tablet Take 4 mg by mouth See admin instructions. 6,5,4,3,2,1 06/03/21   [provider]  ?VYVANSE 50 MG capsule Take 50 mg by mouth daily. 05/19/21   [provider]  ?   ? ?Allergies    ?Patient has no known allergies.   ? ?Review of Systems   ?Review of Systems  ?All other systems reviewed and are negative. ? ?Physical Exam ?Updated Vital Signs ?BP (!) 190/96 (BP Location: Left Arm)   Pulse 92   Resp 20   Ht 5\' 9"  (1.753 m)   Wt 93.4 kg   SpO2 99%   BMI 30.42 kg/m?  ?Physical Exam ?Vitals and nursing note reviewed.  ?45 year old male, resting comfortably and in no acute distress. Vital signs are significant for elevated blood pressure. Oxygen saturation is 99%, which is normal. ?Head is normocephalic and atraumatic. PERRLA, EOMI. Bleeding is noted coming from socket of tooth #17.  He is having to spit out blood every few seconds. ?Neck is nontender and supple without adenopathy. ?Back is nontender and there is no CVA tenderness. ?Lungs are clear without rales, wheezes, or rhonchi. ?Chest is nontender. ?Heart has regular rate and rhythm without murmur. ?Abdomen is  soft, flat, nontender. ?Extremities have no deformity. ?Skin is warm and dry without rash. ?Neurologic: Mental status is normal, cranial nerves are intact, moves all extremities equally. ? ?ED Results / Procedures / Treatments   ?Labs ?(all labs ordered are listed, but only abnormal results are displayed) ?Labs Reviewed  ?CBC WITH DIFFERENTIAL/PLATELET - Abnormal; Notable for the following components:  ?    Result Value  ? RBC 3.27 (*)   ? Hemoglobin 10.9 (*)   ? HCT 31.3 (*)   ? RDW 15.7 (*)   ? All other components within normal limits  ?BASIC METABOLIC PANEL - Abnormal; Notable for the  following components:  ? Glucose, Bld 124 (*)   ? Creatinine, Ser 1.28 (*)   ? All other components within normal limits  ?HEMOGLOBIN AND HEMATOCRIT, BLOOD - Abnormal; Notable for the following components:  ? Hemoglobin 8.4 (*)   ? HCT 26.0 (*)   ? All other components within normal limits  ?PROTIME-INR  ?APTT  ?TYPE AND SCREEN  ?ABO/RH  ? ?Procedures ?Procedures  ? ? ?Medications Ordered in ED ?Medications  ?tranexamic acid (CYKLOKAPRON) IVPB 1,000 mg (0 mg Intravenous Stopped 06/19/21 0206)  ?Thrombi-Pad 3"X3" pad 1 each (1 each Topical Given 06/19/21 0107)  ?morphine (PF) 4 MG/ML injection 4 mg (4 mg Intravenous Given 06/19/21 0504)  ? ? ?ED Course/ Medical Decision Making/ A&P ?  ?                        ?Medical Decision Making ?Amount and/or Complexity of Data Reviewed ?Labs: ordered. ? ?Risk ?Prescription drug management. ? ? ?Bleeding from socket of tooth #17 following extraction 15 days ago.  Old records are reviewed confirming 3 ED visits for bleeding from the socket, eventually managed by referral to oral surgery.  Bleeding was attempted to be controlled first by placement of thrombin pad with pressure and also IV tranexamic acid.  This was not effective.  There was some concern that the patient was not holding adequate pressure.  However, new thrombin pad was inserted and patient again advised to make sure he had sufficient pressure, gauze was given to give enough bulk for him to get pressure.  Bleeding did not stop.  Hemoglobin was checked and is unchanged from recent.  Also, coagulation studies were checked and were normal.  I have contacted the oral surgeon who had seen him before who recommended that Surgicel be packed into the socket and direct pressure applied.  Also recommended injection of lidocaine with epinephrine.  Patient was very resistant to have this done, was requesting pain medication before anything was done.  He was given a dose of morphine.  He was also given IV fluids.  Following  morphine administration, patient stood up to urinate and had a syncopal episode.  There was also delay in obtaining Surgicel to pack into the socket.  When the Surgicel was obtained, the socket was anesthetized with lidocaine 1% with epinephrine and socket with some was packed and then pressure was applied by biting on the Surgicel with gauze on top of it.  There was initial control bleeding, but he states that he started to have some breakthrough bleeding.  Gauze was replaced, Surgicel was kept in place.  Repeat hemoglobin was checked, and it had dropped 2 g.  At this point, he is continuing to apply pressure, case is signed out to Dr. Rush Landmark. ? ?CRITICAL CARE ?Performed by: Dione Booze ?Total critical care time: 60 minutes ?  Critical care time was exclusive of separately billable procedures and treating other patients. ?Critical care was necessary to treat or prevent imminent or life-threatening deterioration. ?Critical care was time spent personally by me on the following activities: development of treatment plan with patient and/or surrogate as well as nursing, discussions with consultants, evaluation of patient's response to treatment, examination of patient, obtaining history from patient or surrogate, ordering and performing treatments and interventions, ordering and review of laboratory studies, ordering and review of radiographic studies, pulse oximetry and re-evaluation of patient's condition. ? ?Final Clinical Impression(s) / ED Diagnoses ?Final diagnoses:  ?Bleeding gums  ?Normochromic normocytic anemia  ?Anemia associated with acute blood loss  ? ? ?Rx / DC Orders ?ED Discharge Orders   ? ? None  ? ?  ? ? ?  ?Dione BoozeGlick, Niralya Ohanian, MD ?06/19/21 [phone] ? ?

## 2021-06-19 NOTE — Hospital Course (Addendum)
#  Symptomatic anemia 2/2 acute blood loss from bleeding dental extraction ?Patient presented with recurrent bleeding from area of dental extraction. In the ED, Dr. Chales Salmon with oral surgery was contacted and recommended giving the patient TXA, surgicel packing, injected lidocaine with epinephrine, and more packing applied with pressure which was done by ED provider. The bleeding did stop however Hgb was noted to drop 10.9>8.4 and he reported fatigue and lightheadedness. Overnight he did not receive blood products and was hemodynamically stable. Hgb was stable from admission. Iron studies collected revealed ferritin 146, iron 70, TIBC 363, saturation ratios 19, UIBC 293; reticulocyte studies were abnormal with increased absolute count at 203.1, RBC L 2.60. Prior to discharge he was given one dose of IV ferrlecit 125 mg. No bleeding noted at time of discharge. ?  ?#HTN ?Antihypertensives held at admission given acute blood loss. He was hypertensive overnight without home regimen. ?  ?#MDD ?ADHD ?Chronic conditions. No home medications restarted.  ?  ?#OSA on BIPAP ?Patient maintained normal oxygen saturations on room air while awake. He was provided with CPAP for sleeping.  ?

## 2021-06-19 NOTE — H&P (Addendum)
?Date: 06/19/2021     ?     ?     ?Patient Name:  Henry Morrow MRN: [id number]  ?DOB: 1975-07-07 Age / Sex: 46 y.o., male   ?PCP: System, Provider Not In    ?     ?Medical Service: Internal Medicine Teaching Service    ?     ?Attending Physician: Dr. Mikey Bussing, Marthenia Rolling, DO    ?First Contact: Thurmon Fair, MD Pager: Cecilie Kicks [phone]  ?     ?     ?After Hours (After 5p/  First Contact Pager: [phone]  ?weekends / holidays): Second Contact Pager: [phone]  ? ?SUBJECTIVE  ? ?Chief Complaint: bleeding from wisdom tooth extraction ? ?History of Present Illness: Henry Morrow is a 46 year old male living with HTN,MDD,ADHD, and wisdom tooth extraction 15 days who presents to the ED for recurrent bleeding from dental extraction.  Patient had visits to the ED on March 2, March 3 and March 4 after initial extraction [facility]  He was referred to and seen in oral surgeon's, Dr. Chales Salmon, office who was able to get the bleeding under control.  Patient has been doing well for 2 weeks but presented to ED for spontaneous bleeding again.  He is not taking any anticoagulants or antiplatelets medications. He has no other history of spontaneous bleeding outside of his dental extraction history.  He was eating popcorn last night, but doesn't recall bleeding starting while eating.  In the ED, Dr. Chales Salmon was contacted and recommended patient be given TXA, surgicel packing, injected lidocaine with epi, and more packing applied with pressure.  Bleeding stopped, but given drop in hemoglobin from 10.9-8.4, symptoms of fatigue and lightheadedness we were called to admit for observation.   ? ?Review of Systems  ?Constitutional:  Positive for malaise/fatigue. Negative for chills and fever.  ?HENT:  Negative for congestion, sinus pain and sore throat.   ?Eyes:  Negative for blurred vision and double vision.  ?Respiratory:  Negative for cough and shortness of breath.   ?Cardiovascular:  Negative for palpitations and leg swelling.   ?Gastrointestinal:  Negative for abdominal pain, nausea and vomiting.  ?Genitourinary:  Negative for frequency and hematuria.  ?Musculoskeletal:  Negative for falls and myalgias.  ?Neurological:  Negative for focal weakness and loss of consciousness.  ?Psychiatric/Behavioral:  Positive for depression. Negative for substance abuse. The patient is nervous/anxious.   ? ? ? ? ?Meds: Patient was prescribed list of medications below.  ?Current Meds  ?Medication Sig  ? acetaminophen (TYLENOL) 500 MG tablet Take 1,000 mg by mouth every 6 (six) hours as needed for moderate pain or headache.  ? amLODipine (NORVASC) 10 MG tablet Take 10 mg by mouth daily.  ?    ? buPROPion (WELLBUTRIN XL) 300 MG 24 hr tablet Take 300 mg by mouth daily.  ?    ? hydrochlorothiazide (HYDRODIURIL) 25 MG tablet Take 1 tablet (25 mg total) by mouth daily.  ? HYDROcodone-acetaminophen (NORCO) 10-325 MG tablet Take 1 tablet by mouth 4 (four) times daily as needed.  ? ibuprofen (ADVIL) 800 MG tablet Take 800 mg by mouth every 8 (eight) hours as needed for headache or moderate pain.  ? VYVANSE 50 MG capsule Take 50 mg by mouth daily.  ? ? ?Past Medical History:  ?Diagnosis Date  ? Hypertension   ? Obesity   ? Sleep apnea   ? ? ?Past Surgical History:  ?Procedure Laterality Date  ? HERNIA REPAIR    ?  WISDOM TOOTH EXTRACTION  06/02/2021  ? ? ?Social:  ?Lives With: Wife and children ?Occupation: Works at [employer] as a Curator ?Support: Wife bedside ?Level of Function: Performs own ADLs and IADLs ?PCP: [facility]. Dorothe Pea, NP ?Substances: ?Social History  ? ?Tobacco Use  ? Smoking status: Some Days  ?  Packs/day: 0.25  ?  Types: Cigarettes  ? Smokeless tobacco: Never  ?Vaping Use  ? Vaping Use: Every day  ?Substance Use Topics  ? Alcohol use: No  ? Drug use: No  ? ? ?Family History: History reviewed. No pertinent family history. No history of bleeding disorders. ? ? ?Allergies: ?Allergies as of 06/19/2021  ? (No Known Allergies)   ? ? ?Review of Systems: ?A complete ROS was negative except as per HPI.  ? ?OBJECTIVE:  ? ?Physical Exam: ?Blood pressure (!) 146/81, pulse 73, temperature 97.9 ?F (36.6 ?C), temperature source Oral, resp. rate 13, height 5\' 9"  (1.753 m), weight 93.4 kg, SpO2 100 %.  ?Constitutional: 46 year old man, obese, in no acute distress, appears tired but attentive during interview.  Field vital shows elevated blood pressure with normal pulse, afebrile, maintains oxygen saturation greater than 97% on room air.  Has some desaturations when sleeping, so is on supplemental oxygen. ?HENT: Pupils equal round and reactive to light, extraocular movement intact, no active bleeding of previous bleeding tooth socket #17 ?Eyes: PERRL conjunctiva, nonicteric sclera ?Neck: supple ?Cardiovascular: regular rate and rhythm, no m/r/g ?Pulmonary/Chest: normal work of breathing on room air, lungs clear to auscultation bilaterally ?Abdominal: soft, non-tender, active bowel sounds ?MSK: normal bulk and tone ?Neurological: alert & oriented x 3, 5/5 strength in bilateral upper and lower extremities ?Skin: warm and dry ?Psych: Normal mood, normal affect ? ?Labs: ?CBC ?   ?Component Value Date/Time  ? WBC 6.5 06/19/2021 0040  ? RBC 3.27 (L) 06/19/2021 0040  ? HGB 8.4 (L) 06/19/2021 06/21/2021  ? HCT 26.0 (L) 06/19/2021 06/21/2021  ? PLT 228 06/19/2021 0040  ? MCV 95.7 06/19/2021 0040  ? MCH 33.3 06/19/2021 0040  ? MCHC 34.8 06/19/2021 0040  ? RDW 15.7 (H) 06/19/2021 0040  ? LYMPHSABS 1.7 06/19/2021 0040  ? MONOABS 0.4 06/19/2021 0040  ? EOSABS 0.1 06/19/2021 0040  ? BASOSABS 0.0 06/19/2021 0040  ?  ? ?CMP  ?   ?Component Value Date/Time  ? NA 138 06/19/2021 0040  ? K 4.1 06/19/2021 0040  ? CL 106 06/19/2021 0040  ? CO2 23 06/19/2021 0040  ? GLUCOSE 124 (H) 06/19/2021 0040  ? BUN 14 06/19/2021 0040  ? CREATININE 1.28 (H) 06/19/2021 0040  ? CALCIUM 9.3 06/19/2021 0040  ? PROT 7.4 07/19/2013 2234  ? ALBUMIN 3.7 07/19/2013 2234  ? AST 31 07/19/2013 2234  ? ALT 29  07/19/2013 2234  ? ALKPHOS 65 07/19/2013 2234  ? BILITOT 0.4 07/19/2013 2234  ? GFRNONAA >60 06/19/2021 0040  ? GFRAA >60 11/29/2016 2058  ? ? ? ?EKG: personally reviewed my interpretation is NSR ? ? ?ASSESSMENT & PLAN:  ? ? ?Assessment & Plan by Problem: ?Principal Problem: ?  Symptomatic anemia ? ? ?Justyce Morrow Potvin is a 46 y.o. living with HTN,MDD,ADHD, and wisdom tooth extraction 15 days who presents to the ED for recurrent bleeding from dental extraction.admitted for symptomatic anemia. ? ?#Symptomatic anemia 2/2 acute blood loss from bleeding dental extraction ?-Patient's bleeding is stable at this point after interventions ( TXA, surgical packing, lidocaine injection with epi and tamponade) in ED. Patient followed by Dr. 52 with  oral surgery [phone]).   ?-Vital signs are stable at this time.  ?-Last CBC 4 hours ago, will check now.  Discussed risk of anemia with patient and he endorsed understanding. He would like to avoid blood transfusion if possible.  If patient shows hemodynamic instability with ongoing blood loss or hemoglobin less than 7 he will need blood transfusion.  Patient will need to be consented, as he did not want to consent for blood transfusion after our discussion.  ?-He reports already having iron deficiency from recent bleeding, will check iron studies and could give patient IV iron while he is here. ? ?#HTN ?-Home antihypertensives are amlodipine 10 and hydrochlorothiazide 25 ?-We will hold home antihypertensives in setting of acute blood loss.  ?-If SBP greater than 160, restart HCTZ or treat PRN ? ?#MDD/ADHD ?- Patient reports he does not take Wellbutrin or Vyvanse daily.  He did not want medications restarted today.  ? ?#OSA on BIPAP ?- Continue BIPAP QHS ?- Patient was on supplemental oxygen when I evaluated him, this was placed for oxygen desaturation when sleeping. Normal oxygen saturation on room air when awake.  ? ?Diet:  Soft ?VTE: None ?IVF: None,None ?Code:  Full ? ?Prior to Admission Living Arrangement: Home ?Anticipated Discharge Location: Home ?Barriers to Discharge: Needs observation overnight for symptomatic anemia.  If patient has no bleeding and improv

## 2021-06-19 NOTE — ED Provider Notes (Signed)
7:59 AM ?Care assumed from Dr. Preston Fleeting.  At time of transfer of care, patient is awaiting repeat hemoglobin and reassessment after he has had hours of hemorrhage from a previous dental procedure.  According to patient and family, baseline hemoglobin is around 14 for him. ? ?Patient is a lower left molar extraction 15 days ago and had to be reassessed by Dr. Chales Salmon with oral surgery  [phone]) recently for recurrent hemorrhage.  Overnight, patient had recurrent bleeding that had a difficult time getting stop.  Dr. Chales Salmon was called by previous team and was given recommendations on management patient was given TXA, Surgicel packing, injected lidocaine with epi, and more packing with pressure.  At 1 point, patient became hypotensive and syncopized but was given some fluids.  Patient had initial hemoglobin on arrival found to be 10.9 however several hours later it is down to 8.4.  Patient has been filling up several canisters of blood suctioned from his mouth. ? ?On my first evaluation, it appears the bleeding has stopped however patient is still feeling fatigued and lightheaded.  Blood pressure has improved however patient is laying flat. ? ?Given the report the patient's hemoglobin is normally 14 and is now 8.4 dropping over 2 points just during his ED stay, in the setting of his syncopal episode, hypotension, and continued fatigue, I am concerned about him going home. ? ?As bleeding has stopped, will hold on calling back Dr. Chales Salmon however, I am concerned about his safety going home at this time.  We will reassess patient and if he is still feeling fatigue, patient may need observation admission for hemoglobin trending and monitoring. ? ? ?Anticipate reassessment shortly now that his packing is been removed. ? ? ?9:23 AM ?Patient was reassessed and the bleeding still appears to have ceased however he is still feeling lightheaded and fatigued.  Given the report his hemoglobin went from 14 at baseline is now 8.4 and  likely still declining given the amount of blood he lost this morning, in the setting of his recent hypotension, syncope, and symptoms I do not feel he is safe for discharge home.  We will call for likely ops admission for hemoglobin trending and monitoring in the setting of syncope and blood loss. ?  ?Annalina Needles, Canary Brim, MD ?06/19/21 1011 ? ?

## 2021-06-20 DIAGNOSIS — K068 Other specified disorders of gingiva and edentulous alveolar ridge: Secondary | ICD-10-CM | POA: Diagnosis present

## 2021-06-20 DIAGNOSIS — D649 Anemia, unspecified: Secondary | ICD-10-CM | POA: Diagnosis not present

## 2021-06-20 DIAGNOSIS — D62 Acute posthemorrhagic anemia: Secondary | ICD-10-CM | POA: Diagnosis present

## 2021-06-20 LAB — CBC
HCT: 25.8 % — ABNORMAL LOW (ref 39.0–52.0)
Hemoglobin: 8.5 g/dL — ABNORMAL LOW (ref 13.0–17.0)
MCH: 32.7 pg (ref 26.0–34.0)
MCHC: 32.9 g/dL (ref 30.0–36.0)
MCV: 99.2 fL (ref 80.0–100.0)
Platelets: 203 10*3/uL (ref 150–400)
RBC: 2.6 MIL/uL — ABNORMAL LOW (ref 4.22–5.81)
RDW: 16.5 % — ABNORMAL HIGH (ref 11.5–15.5)
WBC: 6.4 10*3/uL (ref 4.0–10.5)
nRBC: 0.3 % — ABNORMAL HIGH (ref 0.0–0.2)

## 2021-06-20 LAB — APTT: aPTT: 31 seconds (ref 24–36)

## 2021-06-20 LAB — BASIC METABOLIC PANEL
Anion gap: 7 (ref 5–15)
BUN: 13 mg/dL (ref 6–20)
CO2: 26 mmol/L (ref 22–32)
Calcium: 9.3 mg/dL (ref 8.9–10.3)
Chloride: 109 mmol/L (ref 98–111)
Creatinine, Ser: 1.31 mg/dL — ABNORMAL HIGH (ref 0.61–1.24)
GFR, Estimated: >60 mL/min (ref >60)
Glucose, Bld: 102 mg/dL — ABNORMAL HIGH (ref 70–99)
Potassium: 4.5 mmol/L (ref 3.5–5.1)
Sodium: 142 mmol/L (ref 135–145)

## 2021-06-20 LAB — PROTIME-INR
INR: 1 (ref 0.8–1.2)
Prothrombin Time: 13.3 seconds (ref 11.4–15.2)

## 2021-06-20 MED ORDER — MAGIC MOUTHWASH W/LIDOCAINE: 10.0000 mL | Freq: Four times a day (QID) | ORAL | 1 refills | Status: AC | PRN

## 2021-06-20 MED ORDER — SODIUM CHLORIDE 0.9 % IV SOLN: 250.0000 mg | Freq: Once | INTRAVENOUS | Status: DC

## 2021-06-20 MED ORDER — SODIUM CHLORIDE 0.9 % IV SOLN
125.0000 mg | Freq: Once | INTRAVENOUS | Status: AC
  Administered 2021-06-20: 125 mg via INTRAVENOUS
  Filled 2021-06-20: qty 10

## 2021-06-20 MED ORDER — MAGIC MOUTHWASH W/LIDOCAINE
10.0000 mL | Freq: Four times a day (QID) | ORAL | Status: DC | PRN
  Filled 2021-06-20: qty 10

## 2021-06-20 NOTE — Discharge Instructions (Addendum)
Henry Morrow, ?It was a pleasure taking care of you at [facility] were admitted for bleeding and your bleeding was controlled. We monitored your hemoglobin and they remained stable and above 8. We also gave you an iron infusion to help you build more blood cells. We are discharging you home now that you are doing better. Please follow the following instructions.  ?1) Call the oral surgeon, Dr. Benson Norway [phone]), if you have any questions  ?2) Start taking the Magic Mouth Wash w/ lidocaine 10 mL 4 times as needed for mouth pain ?3) Continue taking Tylenol 1000 mg up to 3 times a day as needed for pain.  ?4) Follow up with your primary care doctor in 1-2 weeks ? ?Take care,  ?Dr. Linwood Dibbles, MD, MPH ? ? ? ? ?

## 2021-06-20 NOTE — Discharge Summary (Addendum)
? ?Name: Henry Morrow ?MRN: [id number] ?DOB: 1975/05/09 46 y.o. ?PCP: System, Provider Not In ? ?Date of Admission: 06/19/2021 12:15 AM ?Date of Discharge:  06/20/2021 ?Attending Physician: Dr. Cleda Daub ? ?DISCHARGE DIAGNOSIS:  ?Primary Problem: Symptomatic anemia  ? ?Hospital Problems: ?Principal Problem: ?  Symptomatic anemia ?Active Problems: ?  Acute blood loss anemia ?  Gingival bleeding ?  ? ?DISCHARGE MEDICATIONS:  ? ?Allergies as of 06/20/2021   ?No Known Allergies ?  ? ?  ?Medication List  ?  ? ?STOP taking these medications   ? ?amoxicillin 500 MG capsule ?Commonly known as: AMOXIL ?  ?ibuprofen 800 MG tablet ?Commonly known as: ADVIL ?  ?methylPREDNISolone 4 MG Tbpk tablet ?Commonly known as: MEDROL DOSEPAK ?  ? ?  ? ?TAKE these medications   ? ?acetaminophen 500 MG tablet ?Commonly known as: TYLENOL ?Take 1,000 mg by mouth every 6 (six) hours as needed for moderate pain or headache. ?  ?amLODipine 10 MG tablet ?Commonly known as: NORVASC ?Take 10 mg by mouth daily. ?What changed: Another medication with the same name was removed. Continue taking this medication, and follow the directions you see here. ?  ?buPROPion 300 MG 24 hr tablet ?Commonly known as: WELLBUTRIN XL ?Take 300 mg by mouth daily. ?  ?chlorhexidine 0.12 % solution ?Commonly known as: PERIDEX ?15 mLs by Mouth Rinse route 2 (two) times daily. ?  ?hydrochlorothiazide 25 MG tablet ?Commonly known as: HYDRODIURIL ?Take 1 tablet (25 mg total) by mouth daily. ?  ?HYDROcodone-acetaminophen 10-325 MG tablet ?Commonly known as: NORCO ?Take 1 tablet by mouth 4 (four) times daily as needed. ?What changed: Another medication with the same name was removed. Continue taking this medication, and follow the directions you see here. ?  ?magic mouthwash w/lidocaine Soln ?Take 10 mLs by mouth 4 (four) times daily as needed for mouth pain. ?  ?Vyvanse 50 MG capsule ?Generic drug: lisdexamfetamine ?Take 50 mg by mouth daily. ?  ? ?  ? ? ?DISPOSITION  AND FOLLOW-UP:  ?Henry Morrow was discharged from [facility] in Stable condition. At the hospital follow up visit please address: ? ?#Symptomatic anemia 2/2 acute blood loss from bleeding dental extraction ?Recheck CBC in 6-8 weeks for resolution of acute anemia. Consider repeat iron studies after period of acute inflammation has resolved to assess for need for outpatient supplementation. ? ?Follow-up Recommendations: ?Consults: Oral surgery ?Labs: CBC, iron studies ?Studies: None ?Medications:  ? ?START: ?Magic mouthwash 4 times daily as needed for mouth pain. ?No other medication changes made. ? ?Follow-up Appointments: ? Follow-up Information   ? ? Jettie Pagan, NP Follow up.   ?Specialty: Nurse Practitioner ?Why: As needed, If symptoms worsen ?Contact information: ?[address] ?[place] [address] ?[phone] ? ? ?  ?  ? ? Dutch Quint, MD Follow up.   ?Specialty: Oral Surgery ?Why: If symptoms worsen, As needed ?Contact information: ?[address] ?[place] [address] ?[phone] ? ? ?  ?  ? ?  ?  ? ?  ? ? ?HOSPITAL COURSE:  ?Patient Summary: ?#Symptomatic anemia 2/2 acute blood loss from bleeding dental extraction ?Patient presented with recurrent bleeding from area of dental extraction. In the ED, Dr. Chales Salmon with oral surgery was contacted and recommended giving the patient TXA, surgicel packing, injected lidocaine with epinephrine, and more packing applied with pressure which was done by ED provider. The bleeding did stop however Hgb was noted to drop 10.9>8.4 and he reported fatigue and lightheadedness.  Overnight he did not receive blood products and was hemodynamically stable. Hgb was stable from admission. Iron studies collected revealed ferritin 146, iron 70, TIBC 363, saturation ratios 19, UIBC 293; reticulocyte studies were abnormal with increased absolute count at 203.1, RBC L 2.60. Prior to discharge he was given one dose of IV  ferrlecit 125 mg. No bleeding noted at time of discharge. ?  ?#HTN ?Antihypertensives held at admission given acute blood loss. He was hypertensive overnight without home regimen. ?  ?#MDD ?ADHD ?Chronic conditions. No home medications restarted.  ?  ?#OSA on BIPAP ?Patient maintained normal oxygen saturations on room air while awake. He was provided with CPAP for sleeping.   ? ?DISCHARGE INSTRUCTIONS:  ? ?Discharge Instructions   ? ? Diet - low sodium heart healthy   Complete by: As directed ?  ? Increase activity slowly   Complete by: As directed ?  ? ?  ? ? ?SUBJECTIVE:  ?Patient evaluated at bedside on day of discharge. He is quite anxious seeming with several questions about his oral surgery. He was worried about feeling fatigued and weak and how that impacts his job safety and possible effects on the people who depend on him to do his job well. We recommended that he take it easy and agreed that heavy lifting and intense work would not be best right now given that it will take his body time to fully recuperate, which he seemed uneasy about. Our team reassured him that his blood levels are stable and that tests we have done show [place] that his body is responding as it should to his bleeding and that at this time we do not see signs of infection. ? ?Discharge Vitals:   ?BP (!) 148/93 (BP Location: Left Arm)   Pulse 69   Temp (!) 97.4 ?F (36.3 ?C) (Tympanic)   Resp 17   Ht 5\' 9"  (1.753 m)   Wt 93.4 kg   SpO2 99%   BMI 30.42 kg/m?  ? ?OBJECTIVE:  ?Constitutional:Well-appearing gentleman resting comfortably in bed. No acute distress. ?Mouth: No bleeding noted in oropharynx. L extraction site without increased erythema or edema at this time.  ?Cardio:Regular rate and rhythm. No murmurs, rubs, gallops. ?Pulm:Normal work of breathing on room air. ?Abdomen:Soft, nontender, nondistended. ? for extremity edema. ?Skin:Warm and dry. ?Neuro:Alert and oriented x3. No focal deficit noted. ?Psych:  Anxious. ? ?Pertinent Labs, Studies, and Procedures:  ?CBC Latest Ref Rng & Units 06/20/2021 06/19/2021 06/19/2021  ?WBC 4.0 - 10.5 K/uL 6.4 6.4 6.6  ?Hemoglobin 13.0 - 17.0 g/dL 06/21/2021) 5.6(C) 1.2(X)  ?Hematocrit 39.0 - 52.0 % 25.8(L) 25.4(L) 25.7(L)  ?Platelets 150 - 400 K/uL 203 189 190  ? ? ?CMP Latest Ref Rng & Units 06/20/2021 06/19/2021 11/15/2020  ?Glucose 70 - 99 mg/dL 11/17/2020) 001(V) 494(W)  ?BUN 6 - 20 mg/dL 13 14 12   ?Creatinine 0.61 - 1.24 mg/dL 967(R) ) 9.16(B  ?Sodium 135 - 145 mmol/L 142 138 137  ?Potassium 3.5 - 5.1 mmol/L 4.5 4.1 4.0  ?Chloride 98 - 111 mmol/L 109 106 105  ?CO2 22 - 32 mmol/L 26 23 26   ?Calcium 8.9 - 10.3 mg/dL 9.3 9.3 9.3  ?Total Protein 6.0 - 8.3 g/dL - - -  ?Total Bilirubin 0.3 - 1.2 mg/dL - - -  ?Alkaline Phos 39 - 117 U/L - - -  ?AST 0 - 37 U/L - - -  ?ALT 0 - 53 U/L - - -  ? ? ?No results found.  ? ?Signed: ?  Champ MungoEmily Kristl Morioka, D.O.  ?Internal Medicine Resident, PGY-1 ?[facility]  ?Pager: [phone] ?12:07 PM, 06/20/2021  ? ?

## 2021-06-20 NOTE — Progress Notes (Signed)
RN gave patient discharge instructions and he stated understanding. IV has been removed and pt is dressed waiting for his ride ?

## 2021-06-21 ENCOUNTER — Telehealth: Payer: Self-pay

## 2021-06-21 NOTE — Telephone Encounter (Signed)
Pt's spouse states the pharmacy need to speak with a nurse to clarify the direction on magic mouthwash w/lidocaine SOLN. Please call back.  ?

## 2023-08-15 IMAGING — MR MR MRA HEAD W/O CM
2 of 3 series · 23 of 48 positions shown · non-contrast
Comparison: No pertinent prior exam.

CLINICAL DATA: Diplopia

EXAM:
MRI HEAD WITHOUT CONTRAST
MRA HEAD WITHOUT CONTRAST
TECHNIQUE: Multiplanar, multi-echo pulse sequences of the brain and surrounding
structures were acquired without intravenous contrast. Angiographic
images of the Circle of Willis were acquired using MRA technique
without intravenous contrast.

[Series 5: DWI · axial · 3.0mm · 0.88mm/px · z∈[-58,+101]mm · 15 of 108 slices shown (1 of 2)]
[im 1/108]
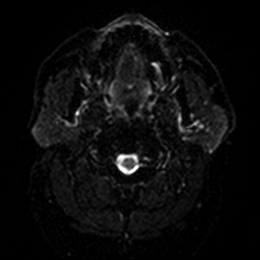
[im 8/108]
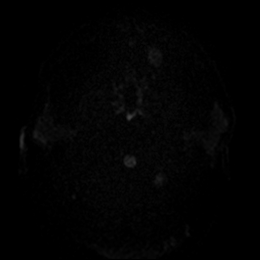
[im 16/108]
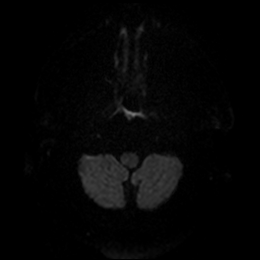
[im 23/108]
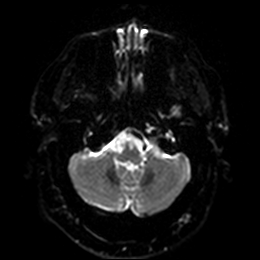
[im 31/108]
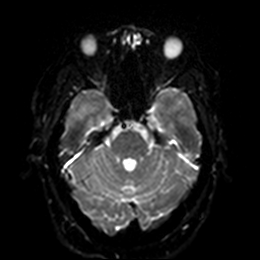
[im 39/108]
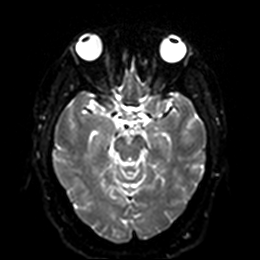
[im 46/108]
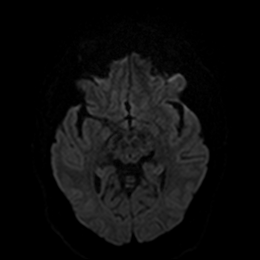
[im 54/108]
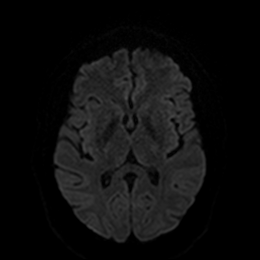
[im 62/108]
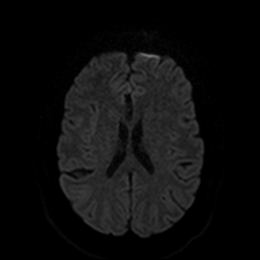
[im 69/108]
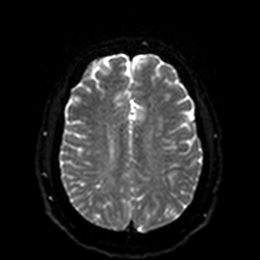
[im 77/108]
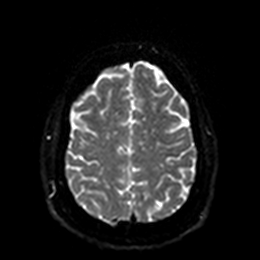
[im 85/108]
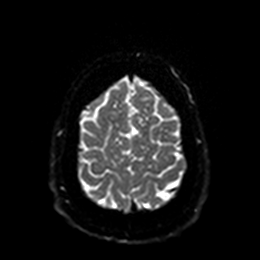
[im 92/108]
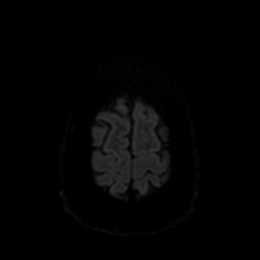
[im 100/108]
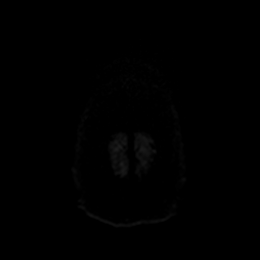
[im 108/108]
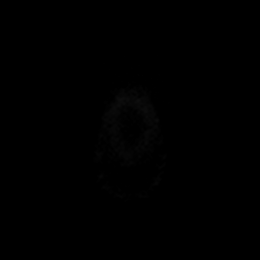

[Series 6: DWI · axial · 3.0mm · 0.88mm/px · z∈[-58,+101]mm · 8 of 54 slices shown (2 of 2)]
[im 1/54]
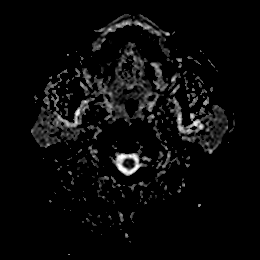
[im 8/54]
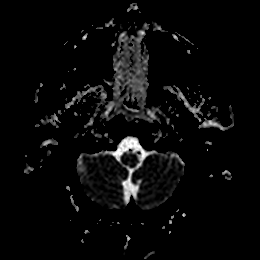
[im 16/54]
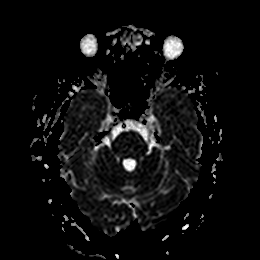
[im 23/54]
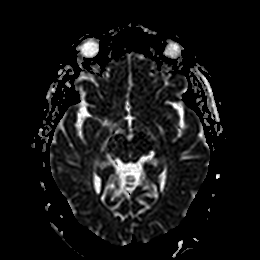
[im 31/54]
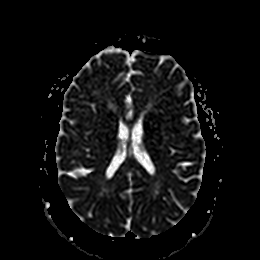
[im 38/54]
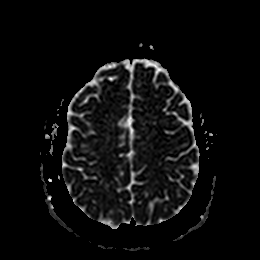
[im 46/54]
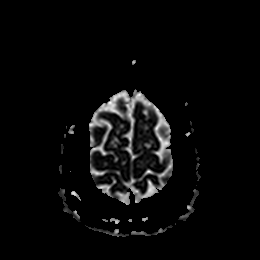
[im 54/54]
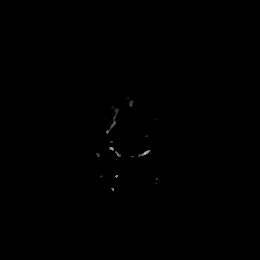

[23 of 48 positions shown; findings below may reference images not displayed]

FINDINGS: MRI HEAD FINDINGS

Brain: No acute infarct, mass effect or extra-axial collection. No
acute or chronic hemorrhage. Normal white matter signal, parenchymal
volume and CSF spaces. The midline structures are normal.

Vascular: Major flow voids are preserved.

Skull and upper cervical spine: Normal calvarium and skull base.
Visualized upper cervical spine and soft tissues are normal.

Sinuses/Orbits:No paranasal sinus fluid levels or advanced mucosal
thickening. No mastoid or middle ear effusion. Normal orbits.

MRA HEAD FINDINGS

POSTERIOR CIRCULATION:

--Vertebral arteries: Normal

--Inferior cerebellar arteries: Normal.

--Basilar artery: Normal.

--Superior cerebellar arteries: Normal.

--Posterior cerebral arteries: Normal.

ANTERIOR CIRCULATION:

--Intracranial internal carotid arteries: Normal.

--Anterior cerebral arteries (ACA): Normal.

--Middle cerebral arteries (MCA): Normal.

ANATOMIC VARIANTS: None
IMPRESSION: Normal MRI/MRA of the brain.

## 2023-08-15 IMAGING — CT CT HEAD W/O CM
4 series · 16 of 47 positions shown, 18 images · non-contrast
Comparison: May 12, 2011

CLINICAL DATA: Shortness of breath and double vision.

EXAM:
CT HEAD WITHOUT CONTRAST
TECHNIQUE: Contiguous axial images were obtained from the base of the skull
through the vertex without intravenous contrast.

[Series 3: head without · axial · non-contrast · 0.46mm/px · z∈[+1320,+1454]mm · 7 of 37 slices shown, 9 images]
[im 5/37  brain]
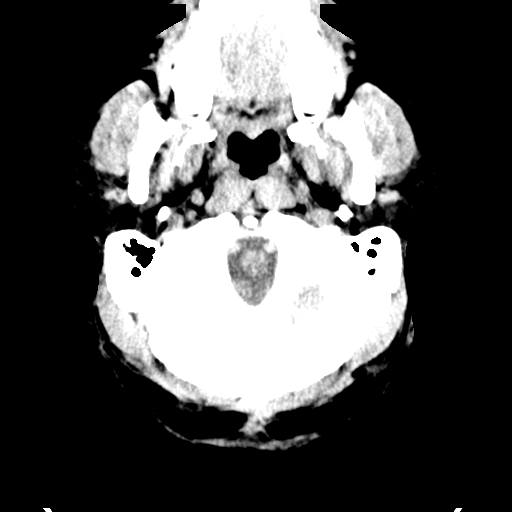
[im 5/37  bone]
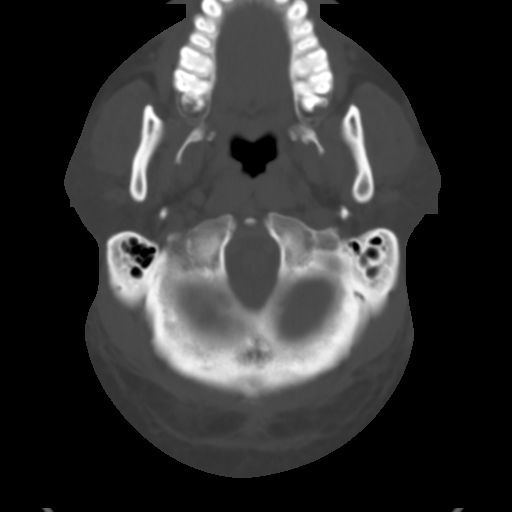
[im 10/37  brain]
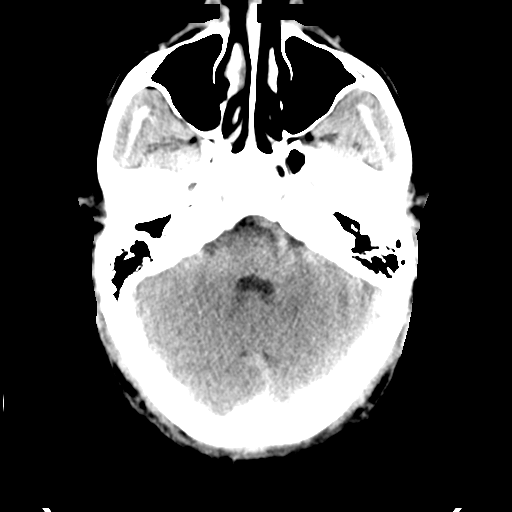
[im 14/37  brain]
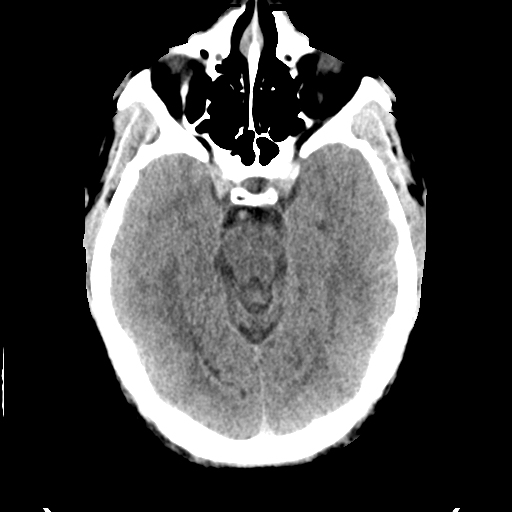
[im 19/37  brain]
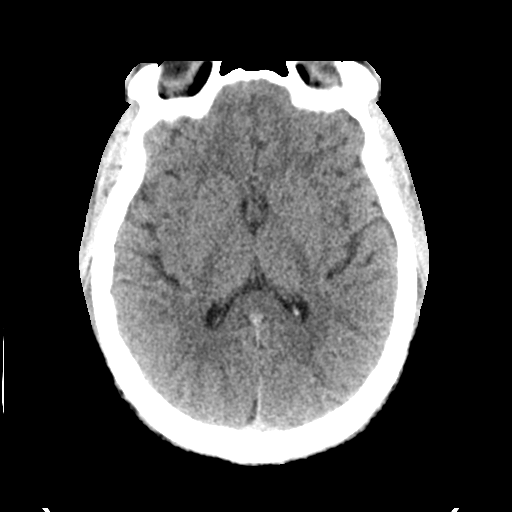
[im 23/37  brain]
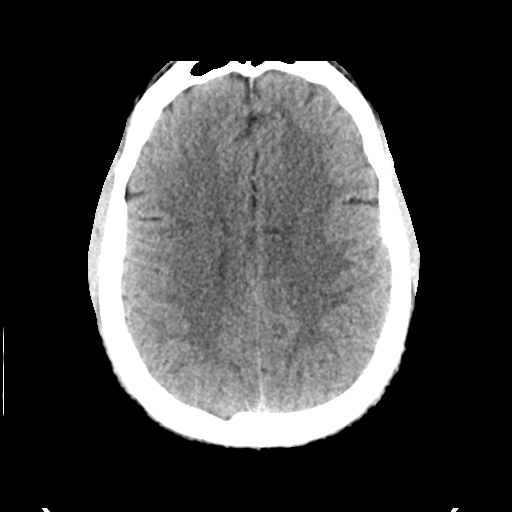
[im 23/37  bone]
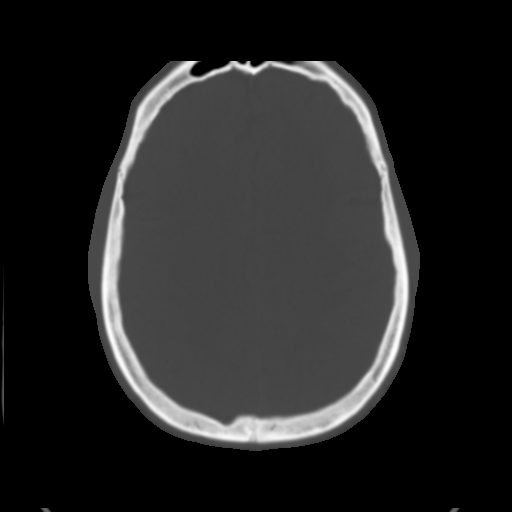
[im 28/37  brain]
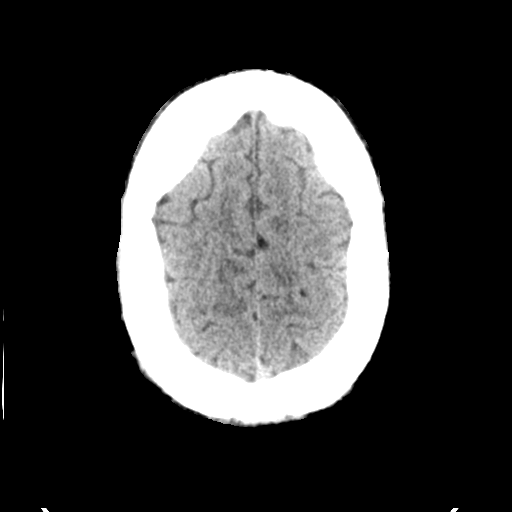
[im 32/37  brain]
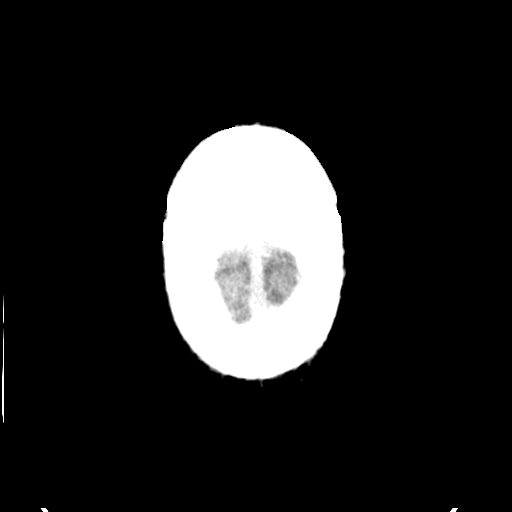

[Series 4: head bone · axial · 0.46mm/px · z∈[+1318,+1354]mm · 3 of 93 slices shown]
[im 10/93  bone]
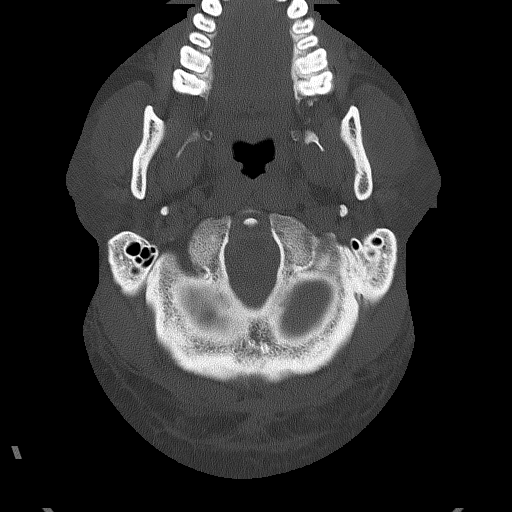
[im 19/93  bone]
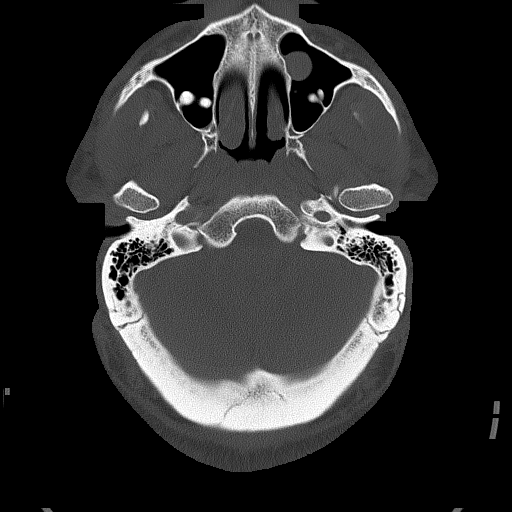
[im 28/93  bone]
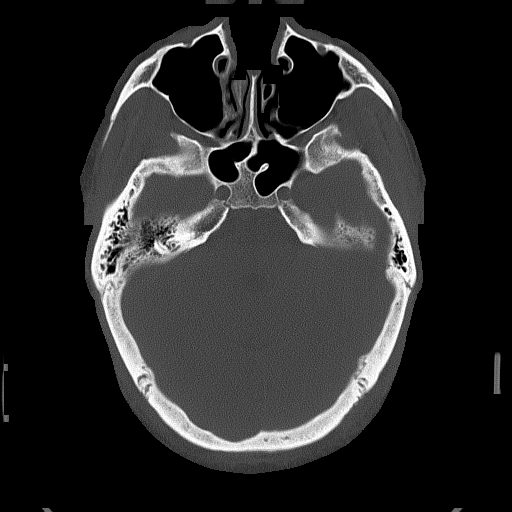

[Series 5: head without cor · coronal · non-contrast · 0.36mm/px · 3 of 77 slices shown]
[im 26/77  brain]
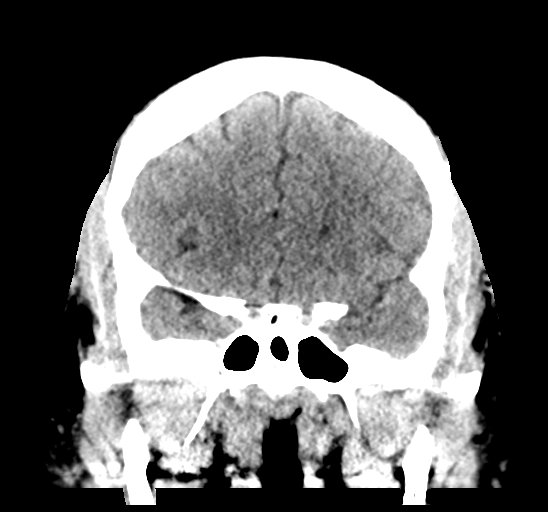
[im 34/77  brain]
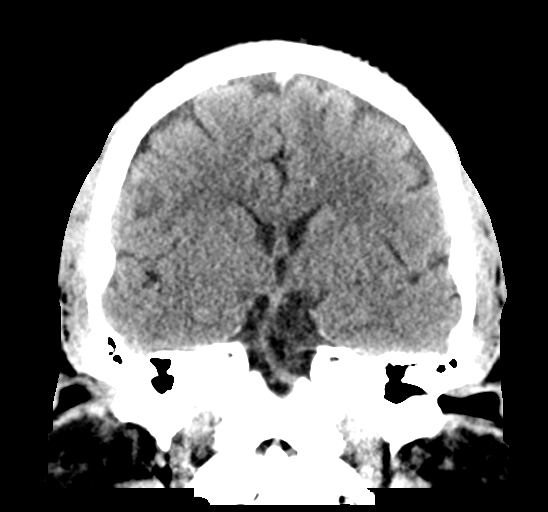
[im 43/77  brain]
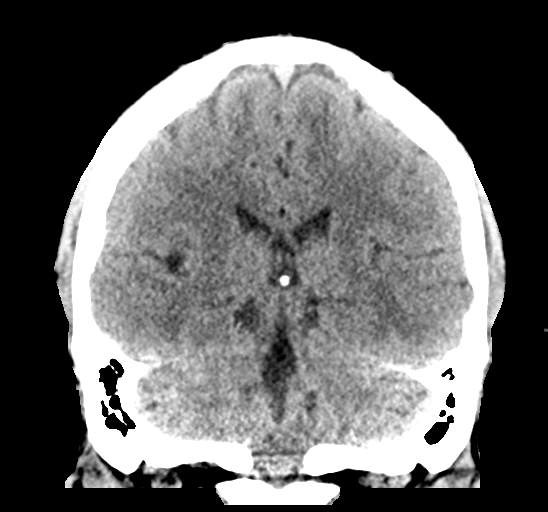

[Series 6: head without sag · sagittal · non-contrast · 0.35mm/px · 3 of 64 slices shown]
[im 22/64  brain]
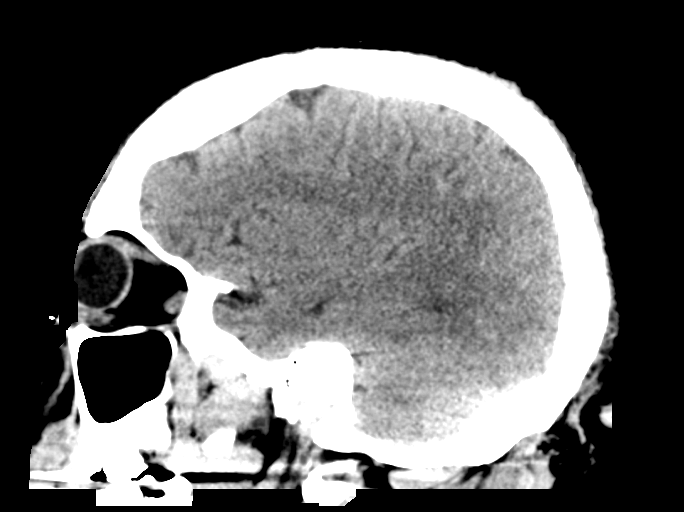
[im 32/64  brain]
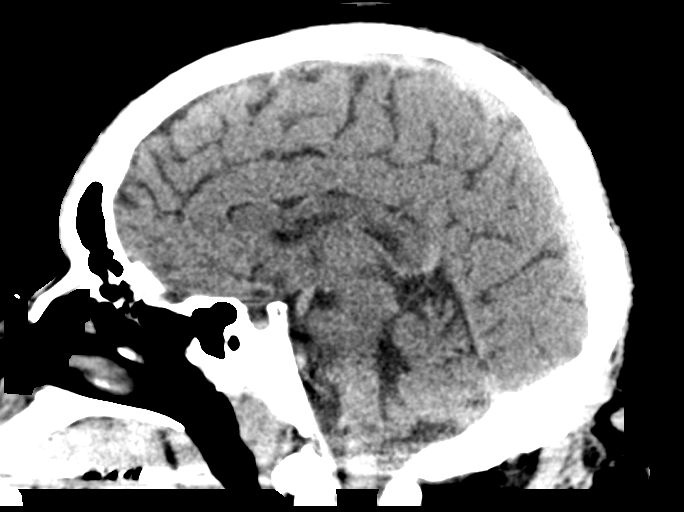
[im 43/64  brain]
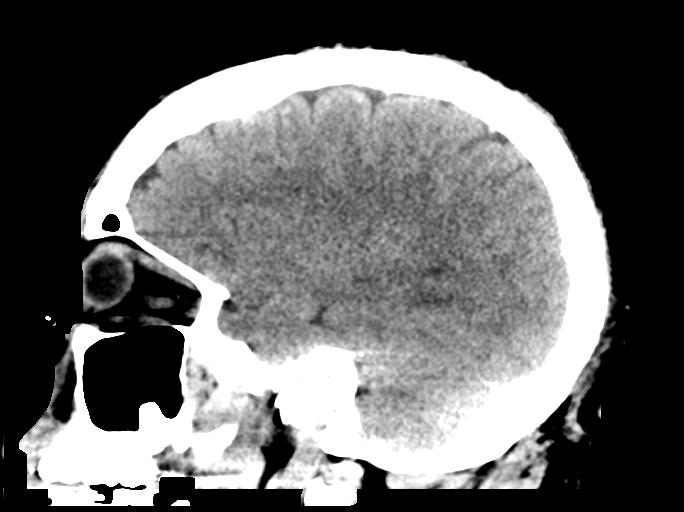

[16 of 47 positions shown; findings below may reference images not displayed]

FINDINGS: Brain: No evidence of acute infarction, hemorrhage, hydrocephalus,
extra-axial collection or mass lesion/mass effect.

Vascular: No hyperdense vessel or unexpected calcification.

Skull: Normal. Negative for fracture or focal lesion.

Sinuses/Orbits: A 1.5 cm x 1.3 cm polyp versus mucous retention cyst
is seen within the anteromedial aspect of the left maxillary sinus.

Other: None.
IMPRESSION: No acute intracranial abnormality.
